# Patient Record
Sex: Male | Born: 1940 | Race: White | Hispanic: No | Marital: Married | Smoking: Former smoker
Health system: Southern US, Community
[De-identification: ages and names within clinical notes are randomized; demographics above are authoritative.]

## PROBLEM LIST (undated history)

## (undated) DIAGNOSIS — K5790 Diverticulosis of intestine, part unspecified, without perforation or abscess without bleeding: Secondary | ICD-10-CM

## (undated) DIAGNOSIS — R059 Cough, unspecified: Secondary | ICD-10-CM

## (undated) DIAGNOSIS — F32A Depression, unspecified: Secondary | ICD-10-CM

## (undated) DIAGNOSIS — R0989 Other specified symptoms and signs involving the circulatory and respiratory systems: Secondary | ICD-10-CM

## (undated) DIAGNOSIS — M1990 Unspecified osteoarthritis, unspecified site: Secondary | ICD-10-CM

## (undated) DIAGNOSIS — E7800 Pure hypercholesterolemia, unspecified: Secondary | ICD-10-CM

## (undated) DIAGNOSIS — I639 Cerebral infarction, unspecified: Secondary | ICD-10-CM

## (undated) DIAGNOSIS — C4490 Unspecified malignant neoplasm of skin, unspecified: Secondary | ICD-10-CM

## (undated) DIAGNOSIS — H9190 Unspecified hearing loss, unspecified ear: Secondary | ICD-10-CM

## (undated) DIAGNOSIS — Z974 Presence of external hearing-aid: Secondary | ICD-10-CM

## (undated) DIAGNOSIS — N189 Chronic kidney disease, unspecified: Secondary | ICD-10-CM

## (undated) HISTORY — DX: Pure hypercholesterolemia, unspecified: E78.00

## (undated) HISTORY — PX: PROSTATE SURGERY: SHX751

## (undated) HISTORY — DX: Unspecified malignant neoplasm of skin, unspecified: C44.90

## (undated) HISTORY — PX: OTHER SURGICAL HISTORY: SHX169

## (undated) HISTORY — PX: COLONOSCOPY: SHX174

## (undated) HISTORY — PX: KNEE ARTHROSCOPY: SUR90

---
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
## 2008-12-16 ENCOUNTER — Ambulatory Visit: Payer: Self-pay | Admitting: Internal Medicine

## 2009-07-27 ENCOUNTER — Ambulatory Visit: Payer: Self-pay | Admitting: Unknown Physician Specialty

## 2010-06-16 ENCOUNTER — Ambulatory Visit: Payer: Self-pay | Admitting: Internal Medicine

## 2010-09-24 ENCOUNTER — Other Ambulatory Visit: Payer: Self-pay | Admitting: Internal Medicine

## 2012-01-31 DIAGNOSIS — I639 Cerebral infarction, unspecified: Secondary | ICD-10-CM

## 2012-01-31 HISTORY — DX: Cerebral infarction, unspecified: I63.9

## 2013-03-27 ENCOUNTER — Inpatient Hospital Stay: Payer: Self-pay | Admitting: Internal Medicine

## 2013-03-27 LAB — URINALYSIS, COMPLETE
Bilirubin,UR: NEGATIVE
Nitrite: POSITIVE
Squamous Epithelial: NONE SEEN
WBC UR: 68 /HPF (ref 0–5)

## 2013-03-27 LAB — CBC
MCH: 30.8 pg (ref 26.0–34.0)
MCHC: 34.2 g/dL (ref 32.0–36.0)
MCV: 90 fL (ref 80–100)
Platelet: 123 10*3/uL — ABNORMAL LOW (ref 150–440)
RDW: 13.1 % (ref 11.5–14.5)
WBC: 23.4 10*3/uL — ABNORMAL HIGH (ref 3.8–10.6)

## 2013-03-27 LAB — COMPREHENSIVE METABOLIC PANEL
Alkaline Phosphatase: 82 U/L (ref 50–136)
Calcium, Total: 8.8 mg/dL (ref 8.5–10.1)
Chloride: 101 mmol/L (ref 98–107)
Co2: 25 mmol/L (ref 21–32)
Creatinine: 1.47 mg/dL — ABNORMAL HIGH (ref 0.60–1.30)
EGFR (Non-African Amer.): 48 — ABNORMAL LOW
Osmolality: 271 (ref 275–301)
Potassium: 3.5 mmol/L (ref 3.5–5.1)
SGPT (ALT): 15 U/L (ref 12–78)
Sodium: 134 mmol/L — ABNORMAL LOW (ref 136–145)
Total Protein: 6.3 g/dL — ABNORMAL LOW (ref 6.4–8.2)

## 2013-03-27 LAB — PHOSPHORUS: Phosphorus: 1.2 mg/dL — ABNORMAL LOW (ref 2.5–4.9)

## 2013-03-28 LAB — CBC WITH DIFFERENTIAL/PLATELET
Basophil #: 0 10*3/uL (ref 0.0–0.1)
Basophil %: 0.1 %
Eosinophil %: 0 %
HCT: 38.1 % — ABNORMAL LOW (ref 40.0–52.0)
Lymphocyte #: 1.4 10*3/uL (ref 1.0–3.6)
MCV: 89 fL (ref 80–100)
Monocyte %: 5.7 %
Neutrophil #: 20.1 10*3/uL — ABNORMAL HIGH (ref 1.4–6.5)
Neutrophil %: 87.9 %
RBC: 4.28 10*6/uL — ABNORMAL LOW (ref 4.40–5.90)
RDW: 12.9 % (ref 11.5–14.5)
WBC: 22.9 10*3/uL — ABNORMAL HIGH (ref 3.8–10.6)

## 2013-03-28 LAB — LIPID PANEL
Cholesterol: <50 mg/dL — ABNORMAL LOW (ref 0–200)
HDL Cholesterol: 20 mg/dL — ABNORMAL LOW (ref 40–60)

## 2013-03-28 LAB — HEPATIC FUNCTION PANEL A (ARMC)
Alkaline Phosphatase: 66 U/L (ref 50–136)
Bilirubin, Direct: 0.2 mg/dL (ref 0.00–0.20)
Bilirubin,Total: 1.6 mg/dL — ABNORMAL HIGH (ref 0.2–1.0)
SGOT(AST): 50 U/L — ABNORMAL HIGH (ref 15–37)
Total Protein: 5.5 g/dL — ABNORMAL LOW (ref 6.4–8.2)

## 2013-03-28 LAB — BASIC METABOLIC PANEL
Calcium, Total: 8.2 mg/dL — ABNORMAL LOW (ref 8.5–10.1)
Chloride: 104 mmol/L (ref 98–107)
Co2: 23 mmol/L (ref 21–32)
Creatinine: 1.27 mg/dL (ref 0.60–1.30)
Osmolality: 272 (ref 275–301)
Potassium: 3.6 mmol/L (ref 3.5–5.1)

## 2013-03-28 LAB — PHOSPHORUS: Phosphorus: 1.7 mg/dL — ABNORMAL LOW (ref 2.5–4.9)

## 2013-03-29 LAB — URINE CULTURE

## 2013-03-29 LAB — BASIC METABOLIC PANEL
BUN: 15 mg/dL (ref 7–18)
Calcium, Total: 8 mg/dL — ABNORMAL LOW (ref 8.5–10.1)
Chloride: 106 mmol/L (ref 98–107)
Co2: 25 mmol/L (ref 21–32)
Creatinine: 1.1 mg/dL (ref 0.60–1.30)
EGFR (African American): >60
Glucose: 104 mg/dL — ABNORMAL HIGH (ref 65–99)
Potassium: 3.8 mmol/L (ref 3.5–5.1)

## 2013-03-29 LAB — CBC WITH DIFFERENTIAL/PLATELET
Basophil #: 0 10*3/uL (ref 0.0–0.1)
HCT: 36.7 % — ABNORMAL LOW (ref 40.0–52.0)
Lymphocyte %: 10.7 %
MCV: 90 fL (ref 80–100)
Monocyte #: 1.2 x10 3/mm — ABNORMAL HIGH (ref 0.2–1.0)
Monocyte %: 7.9 %
Neutrophil #: 12.6 10*3/uL — ABNORMAL HIGH (ref 1.4–6.5)
Platelet: 90 10*3/uL — ABNORMAL LOW (ref 150–440)
RDW: 13 % (ref 11.5–14.5)
WBC: 15.7 10*3/uL — ABNORMAL HIGH (ref 3.8–10.6)

## 2013-03-29 LAB — PHOSPHORUS: Phosphorus: 2.2 mg/dL — ABNORMAL LOW (ref 2.5–4.9)

## 2013-04-01 LAB — CULTURE, BLOOD (SINGLE)

## 2014-02-25 ENCOUNTER — Encounter: Payer: Self-pay | Admitting: Family Medicine

## 2014-03-01 ENCOUNTER — Encounter: Payer: Self-pay | Admitting: Family Medicine

## 2014-04-01 ENCOUNTER — Encounter: Payer: Self-pay | Admitting: Family Medicine

## 2014-11-21 NOTE — Discharge Summary (Signed)
PATIENT NAME:  Randy Hampton, Randy Hampton MR#:  [id number] DATE OF BIRTH:  [birth year]  DATE OF ADMISSION:  03/27/2013 DATE OF DISCHARGE:  03/29/2013  PRIMARY CARE PHYSICIAN: Salome Holmes, MD  FINAL DIAGNOSES:  1.  Clinical sepsis with urinary tract infection.  2.  Urinary retention and benign prostatic hypertrophy.  3.  Acute renal failure.  4.  History of cerebrovascular accident.  5.  Hyperlipidemia.  6.  Thrombocytopenia.  7.  Hypophosphatemia.   DISCHARGE MEDICATIONS: 1.  Atorvastatin 20 mg at bedtime. 2.  Trazodone 50 mg at bedtime. 3.  Aspirin 81 mg daily. 4.  Azelastine nasal spray 137 mcg/inhalation 2 sprays each nostril twice a day. 5.  Vitamin D3 2000 international units daily. 6.  Finasteride 5 mg daily. 7.  Terazosin 10 mg at bedtime. 8.  Cephalexin 500 mg every 8 hours for 7 more days.  DISCHARGE FOLLOWUP:  With Dr. Bernardo Heater in 1 week to remove Foley. Follow up in 1 to 2 weeks with Dr. Salome Holmes.   DIET: Low sodium diet, regular consistency.  ADDITIONAL INSTRUCTIONS: Foley to leg bag.   REASON FOR ADMISSION: The patient was admitted 03/27/2013 and discharged 03/29/2013. Came in with weakness.   HISTORY OF PRESENT ILLNESS: The patient is a 74 year old man with history of BPH and stroke last year with residual left-sided weakness. He had a fever, found to have urinary retention. A Foley catheter was placed in the ER. The patient was started on IV Rocephin and was admitted with clinical sepsis.  LABORATORY AND DIAGNOSTICS: During the hospital course included: A urine culture that grew out Coag-negative staph. Blood cultures were negative. Phosphorus was 1.2. Magnesium 1.3. Urinalysis: 2+ leukocyte esterase, positive nitrites, 2+ blood. Glucose 116, BUN 17, creatinine 1.47, sodium 134, potassium 3.5, chloride 101, CO2 25, calcium 8.8. Liver function tests: Albumin low at 3.3. White blood cell count 23.4, H and H 14.1 and 41.3 and platelet count of 123. CT scan of the head: Old  lacunar infarcts bilaterally. No acute changes.  Chest x-ray: No acute cardiopulmonary disease. Lactic acid 2.3. Ultrasound of the kidneys showed unremarkable evaluation of the kidney. Diffusely thickened urinary bladder wall. Further evaluation with direct visualization recommended. Phosphorus came up to 1.7, magnesium up to 1.8. LDL was unable to be calculated due to non-numeric value within the calculation. HDL was 20, triglycerides 60 and total cholesterol less than 50. Creatinine had improved to 1.11 upon discharge was. White count down from 15.7 and platelet count is 90. Phosphorus was up to 2.2.   HOSPITAL COURSE PER PROBLEM LIST:  1.  For the patient's clinical sepsis and UTI, unfortunately a urine culture grew out Coag-negative staph so I do think this was a contamination. I will complete a course though. Since the patient did get better with the Rocephin, I switched him over to Keflex upon discharge. The patient was afebrile upon discharge. Heart rate 72. White count trending towards the normal range, but was still slightly elevated upon discharge.  2.  For the patient's urinary retention and BPH, I am treating with terazosin and Proscar. The patient had a Foley catheter placed. That will stay in for 7 to 10 days. Follow-up with Dr. Bernardo Heater as outpatient.  3.  Acute renal failure. This was secondary to obstruction. With the Foley catheter and IV fluids creatinine improved.  4.  History of CVA. The patient is on aspirin.  5.  History of hyperlipidemia. The patient on statin. Cholesterol profile is very low.  6.  Thrombocytopenia.  Unclear if this is old or new. Can be further worked up as outpatient. This could also be secondary to the sepsis. Recommend checking a CBC in follow-up appointment.  7.  Hypophosphatemia. This was replaced during the hospital course. Also hypomagnesemia was replaced during the hospital course.   TIME SPENT ON DISCHARGE: 35 minutes.   ____________________________ Tana Conch. Leslye Peer, MD rjw:sb D: 03/29/2013 16:37:28 ET T: 03/29/2013 16:59:51 ET JOB#: [id number]  cc: Tana Conch. Leslye Peer, MD, <Dictator> Salome Holmes, MD Scott C. Bernardo Heater, MD  Marisue Brooklyn MD ELECTRONICALLY SIGNED 04/02/2013 12:20

## 2014-11-21 NOTE — Consult Note (Signed)
Brief Consult Note: Diagnosis: Urinary retention/Acute prostatitis.   Patient was seen by consultant.   Comments: Cont Foley and antibiotics- full consult to follow.  Electronic Signatures: Abbie Sons (MD)  (Signed 28-Aug-14 08:26)  Authored: Brief Consult Note   Last Updated: 28-Aug-14 08:26 by Abbie Sons (MD)

## 2014-11-21 NOTE — H&P (Signed)
PATIENT NAME:  Randy Hampton, Randy Hampton MR#:  [id number] DATE OF BIRTH:  November 01, 1940  DATE OF ADMISSION:  03/27/2013  REFERRING PHYSICIAN: Briant Sites. Joni Fears, MD  PRIMARY CARE PHYSICIAN: Salome Holmes, MD  CHIEF COMPLAINT: Weakness.   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 He has a urologist as an outpatient who he has been following with. He has history of urinary retention while he was hospitalized last year for his stroke. He comes in after experiencing weakness and fevers since yesterday. He normally walks with a 4-prong walker, uses a lift chair to get up. This morning, he was so weak that he could not stand up. Overnight, he also had a fever as high as 104. He states that he has urinary urgency, but cannot urinate. He came in here and was noted to have urinary retention of about 550 mL. He attempted to urinate, and only urinated 50 mL. A Foley has been ordered. Furthermore, the patient had a fever and also tachycardia and leukocytosis. His creatinine is also elevated. Hospitalist services were contacted for further evaluation and management. Of note, the patient has a dose of ceftriaxone.   PAST MEDICAL HISTORY:  1. BPH.  2. History of urinary retention.  3. Depression.  4. 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  5. Hyperlipidemia.  SURGERIES: 1. Knee surgeries x2.  2. 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   FAMILY HISTORY: Hyperlipidemia in mother.   ALLERGIES: AGGRENOX.   SOCIAL HISTORY: No tobacco, alcohol or drug use. Lives with his wife. Retired.   OUTPATIENT MEDICATIONS:  1. Aspirin 81 mg daily.  2. Tamsulosin 0.4 mg 2 times a day.  3. Atorvastatin 20 mg daily.  4. Azelastine 2 sprays 2 times a day.  5. Trazodone 100 mg daily.  6. Vitamin D3 2000  international units daily.   REVIEW OF SYSTEMS:  CONSTITUTIONAL: Weight is steady. Positive for weakness, fevers.  EYES: Has chronic blurry vision and wears glasses.  ENT: Has hearing aids and hearing loss. No nasal discharge.  RESPIRATORY: Has nocturnal cough. Gets better if he props up a couple of pillows under his head. No wheezing, shortness of breath, dyspnea on exertion, asthma or painful respirations.  CARDIOVASCULAR: Denies chest pain, palpitations, swelling in the legs, arrhythmia or history of MI.  GASTROINTESTINAL: No nausea, vomiting, diarrhea, abdominal pain, melena, bloody stools or dark stools.  GENITOURINARY: Has no hematuria or dysuria, but has urgency. Wants to urinate, but he cannot.  HEMATOLOGIC AND LYMPHATIC: No anemia or easy bruising.  SKIN: No rashes.  MUSCULOSKELETAL: Denies arthritis or gout.  NEUROLOGIC: Has left-sided weakness secondary to his stroke, has rigidity and fasciculations.  PSYCHIATRIC: Has depression.   PHYSICAL EXAMINATION:  VITAL SIGNS: Temperature on arrival noted to be 99.2 initially, T-max was 100.7, initial pulse rate 116, respiratory rate 18, blood pressure 122/62, O2 saturation 97% on room air.  GENERAL: The patient is a well-developed Caucasian male lying in bed in no obvious distress.  HEENT: Normocephalic, atraumatic. Pupils are equal and reactive. Anicteric sclerae. Extraocular muscles intact. Moist mucous membranes.  NECK: Supple. No thyroid tenderness. No cervical lymphadenopathy. No JVD.  CARDIOVASCULAR: S1, S2, tachycardic. No murmurs, rubs or gallops.  LUNGS: Clear to auscultation without wheezing, rhonchi or rales.  ABDOMEN: Soft, nontender, nondistended. Positive bowel sounds in all quadrants. Somewhat  distended bladder, without a Foley currently.  EXTREMITIES: Does not exhibit any lower extremity edema.  NEUROLOGIC: Cranial nerves II through XII grossly intact. Strength is 5 out of 5 on the right upper and lower extremities, is 4+  out of 5 in the left lower extremity and 4 out of 5 in the left upper extremity. The patient has rigidity in the upper extremity and poor coordination and has visible fasciculations in the thigh muscles. Sensation is intact to light touch.  PSYCHIATRIC: Awake, alert, oriented x3. Pleasant, cooperative.   LABORATORY DATA: Glucose 116, BUN 17, creatinine 1.47, sodium 134, potassium 3.5, phosphorus is 1.2, magnesium 1.3, albumin os 3.3, total protein 6.3, bilirubin is 3.5. Alkaline phosphatase, AST and ALT are within normal limits. WBC is 23.4, hemoglobin 14.1, platelets are 123. UA shows 2+ blood, positive nitrites, 2+ leukocyte esterase, 5 RBC and 68 WBC, 1+ bacteria. CAT scan of the head without contrast, done for weakness, shows no evidence of acute ischemic or hemorrhagic infarct. Old lacunar infarcts in the basal ganglia bilaterally are present. EKG shows sinus tach, rate is 111, some PVCs which are frequent. Chest x-ray, 1 view, shows no acute cardiopulmonary disease.   ASSESSMENT AND PLAN: We have a 74 year old male with history of a stroke, depression, benign prostatic hypertrophy, urinary retention in the past, hyperlipidemia, who presents with weakness and confusion per wife, who seems better this morning after fever has come down, presenting with severe sepsis, renal failure and urinary retention with electrolyte abnormalities. At this point, would admit the patient to the hospital. The patient has severe sepsis per criteria, including tachycardia, fever, leukocytosis, renal failure and urinary tract infection as a source. He has acute urinary retention as well, likely secondary to his benign prostatic hypertrophy and urinary tract infection. At this point, would follow with the blood cultures and urine cultures, which I have ordered. He has received a dose of ceftriaxone, which I would continue. I will start the patient on Tylenol for fevers. Would decompress the bladder. Obtain a renal ultrasound as  well for the renal failure, which I suspect is secondary to urinary retention and sepsis. Would start the patient on some gentle fluids as well and see how he does with the above therapy. Hopefully, the renal failure should improve with Foley placement and decompression of the bladder, and would order a renal ultrasound to see if there is any evidence for hydronephrosis. Will also obtain a urology consult. Of note, he sees a Dr. Ethelene Hal in [place], and the next appointment, however, is close to 2 months from now. Will see if we can obtain a urology consult here. He has severe laboratory abnormalities, including hypomagnesemia and hypophosphatemia. We will recheck a magnesium in the morning as well as a phosphate and provide Neutra-Phos replacement, and magnesium replacement has already been ordered. We would also resume the aspirin and statin for his history of stroke. We would continue the tamsulosin and add Proscar as well for the benign prostatic hypertrophy. He has elevated bilirubinemia, but normal alkaline phosphatase, AST and ALT. I would go ahead and obtain a direct bilirubin and recheck hepatic function in the morning as well.   CODE STATUS: The patient is DNR.   TOTAL TIME SPENT: 60 minutes.   ____________________________ Vivien Presto, MD sa:OSi D: 03/27/2013 12:14:50 ET T: 03/27/2013 12:27:42 ET JOB#: [id number]  cc: Vivien Presto, MD, <Dictator> Salome Holmes, MD Vivien Presto MD ELECTRONICALLY SIGNED 04/23/2013 13:46

## 2014-11-21 NOTE — Consult Note (Signed)
PATIENT NAME:  Randy Hampton, Randy Hampton MR#:  [id number] DATE OF BIRTH:  [birth year]  DATE OF CONSULTATION:  03/28/2013  REFERRING PHYSICIAN: Dr. Bridgette Habermann CONSULTING PHYSICIAN:  Nicki Reaper C. Bernardo Heater, MD  REASON FOR CONSULTATION: Urinary retention/urinary tract infection.   HISTORY OF PRESENT ILLNESS: This is a 74 year old male followed by [facility] for BPH, who presented to the Emergency Department on 08/27 with weakness, fever to 104 degrees, urinary urgency and inability to urinate. On presentation to the Emergency Department, residual was found to be 550 mL.  Foley catheter was placed. He was also noted to have fever, tachycardia and leukocytosis. He was admitted for sepsis. He is followed by [facility]. He states he was having problem with BPH prior to his stroke last year; however, had a history of urinary retention while hospitalized for the stroke. He states he cannot remember the extent of  evaluation by the urologist, but his wife would know, who was not present at the time of this visit. He currently feels better after catheter placement and antibiotics.   PAST MEDICAL HISTORY:  1.  BPH with urinary retention.  2.  Depression.  3.  CVA with left-sided hemiparesis.  4.  Hyperlipidemia.   PAST SURGICAL HISTORY:  1.  Knee surgeries x 2.  2.  Skin cancer excisions.   FAMILY HISTORY: Noncontributory.   ALLERGIES: AGGRENOX.   SOCIAL HISTORY: No tobacco or alcohol use. The patient is retired.   MEDICATIONS ON ADMISSION: ASA 81 mg daily, tamsulosin 0.4 mg b.i.d. Atorvastatin 20 mg daily,  trazodone 100 mg daily and vitamin D3 2000 international units daily.   REVIEW OF SYSTEMS: Otherwise noncontributory except as per the HPI.   PHYSICAL EXAMINATION:  VITAL SIGNS: Temperature 99.4, pulse 79, BP 95/58.  GENERAL: Alert male in no acute distress.  ABDOMEN: Soft, nontender.  GU: Phallus without lesions. Foley catheter draining darker urine. Prostate exam was deferred at this time.   DATA:  Creatinine on admission 1.47, 1.27 this morning. WBC 22.9. Renal ultrasound shows no hydronephrosis. The bladder is decompressed. Blood cultures are negative to date. Urine is being held for possible pathogen.   IMPRESSION:  1.  Febrile urinary tract infection - probable acute prostatitis.  2.  Benign prostatic hypertrophy.  3.  Urinary retention.   RECOMMENDATIONS:  1.  Continue IV antibiotics pending urine culture results.  2.  Continue tamsulosin.  3.  I would recommend Foley catheter drainage for 7 to 10 days.  4.  He may follow up with [facility] for catheter removal and further evaluation.  ____________________________ Ronda Fairly Bernardo Heater, MD scs:aw D: 03/28/2013 13:17:29 ET T: 03/28/2013 13:39:02 ET JOB#: [id number]  cc: Nicki Reaper C. Bernardo Heater, MD, <Dictator> Abbie Sons MD ELECTRONICALLY SIGNED 04/03/2013 11:16

## 2018-08-10 ENCOUNTER — Other Ambulatory Visit: Payer: Self-pay | Admitting: Internal Medicine

## 2018-08-10 ENCOUNTER — Other Ambulatory Visit: Payer: Self-pay | Admitting: Family Medicine

## 2018-08-10 DIAGNOSIS — R1312 Dysphagia, oropharyngeal phase: Secondary | ICD-10-CM

## 2018-09-05 ENCOUNTER — Other Ambulatory Visit: Payer: Self-pay | Admitting: Family Medicine

## 2018-09-05 ENCOUNTER — Ambulatory Visit
Admission: RE | Admit: 2018-09-05 | Discharge: 2018-09-05 | Disposition: A | Discharge status: Home / Self Care | Payer: Medicare HMO | Source: Ambulatory Visit | Attending: Family Medicine | Admitting: Family Medicine

## 2018-09-05 DIAGNOSIS — R1312 Dysphagia, oropharyngeal phase: Secondary | ICD-10-CM | POA: Insufficient documentation

## 2018-09-05 NOTE — Therapy (Signed)
[place]andy [place], [place], [address] Phone: [phone]   Fax:     Modified Barium Swallow  Patient Details  Name: Randy Hampton MRN: [id number] Date of Birth: [birth year] No data recorded  Encounter Date: 09/05/2018    No past medical history on file.    There were no vitals filed for this visit.   Subjective: Patient behavior: (alertness, ability to follow instructions, etc.): The patient is alert, able to verbalize his swallowing complaints, and follow directions.  Chief complaint:  The patient reports coughing with drinking through straw (if he puts the straw too far into his mouth) and eating mixed textures (cereal and milk)   Objective:  Radiological Procedure: A videoflouroscopic evaluation of oral-preparatory, reflex initiation, and pharyngeal phases of the swallow was performed; as well as a screening of the upper esophageal phase.  I. POSTURE: Upright in MBS chair  II. VIEW: Lateral  III. COMPENSATORY STRATEGIES: N/A  IV. BOLUSES ADMINISTERED:   Thin Liquid: 2 cup rim, 2 straw sips   Nectar-thick Liquid: 1 moderate   Honey-thick Liquid: DNT   Puree: 2 teaspoon presentations   Mechanical Soft: 1/4 graham cracker in applesauce  V. RESULTS OF EVALUATION: A. ORAL PREPARATORY PHASE: (The lips, tongue, and velum are observed for strength and coordination)       **Overall Severity Rating: within functional limits (slowed oral management, but no other concerns)  B. SWALLOW INITIATION/REFLEX: (The reflex is normal if "triggered" by the time the bolus reached the base of the tongue)  **Overall Severity Rating: Mild; triggers at the valleculae  C. PHARYNGEAL PHASE: (Pharyngeal function is normal if the bolus shows rapid, smooth, and continuous transit through the pharynx and there is no pharyngeal residue after the swallow)  **Overall Severity Rating: within normal  limits   D. LARYNGEAL PENETRATION: (Material entering into the laryngeal inlet/vestibule but not aspirated) NONE  E. ASPIRATION: NONE  F. ESOPHAGEAL PHASE: (Screening of the upper esophagus) No abnormality within the viewable cervical esophagus  ASSESSMENT: This 78 year old man; with concern for aspiration secondary reported coughing with drinking through straw and eating mixed textures; is presenting with minimal oropharyngeal dysphagia characterized by delayed pharyngeal swallow initiation.  Oral control of the bolus including oral hold, rotary mastication, and anterior to posterior transfer is within functional limits (slowed oral management, but no other concerns).   Aspects of the pharyngeal stage of swallowing including tongue base retraction, hyolaryngeal excursion, epiglottic inversion, and duration/amplitude of UES opening are within normal limits.  There is no observed pharyngeal residue, laryngeal penetration, or tracheal aspiration.  The patient is not at significant risk for prandial aspiration.  The patient was counseled that his swallowing is safe from an aspiration point of view.  He was advised to avoid textures or methods of drinking that cause him to cough.  However, he can be re-assured that even if he did aspirate, the cough is protective and he should be fine.  PLAN/RECOMMENDATIONS:   A. Diet: Regular   B. Swallowing Precautions: avoid mixed textures, avoid straw drinking that elicits cough   C. Recommended consultation to: N/A   D. Therapy recommendations: speech therapy is not indicated   E. Results and recommendations were discussed with the patient immediately following the study and the final report routed to the referring MD.   Oropharyngeal dysphagia - Plan: DG SWALLOW FUNC OP MEDICARE SPEECH PATH, DG SWALLOW FUNC OP MEDICARE SPEECH PATH, CANCELED: DG SWALLOW FUNC W VID CINE SCOUT  NECK DELAYED IMAGE WITH BA, CANCELED: DG SWALLOW FUNC W VID CINE SCOUT NECK DELAYED  IMAGE WITH BA        Problem List There are no active problems to display for this patient.  Randy Sea, MS/CCC- SLP  Lou Miner 09/05/2018, Wandra Mannan PM  [facility] [place], [place], [address] Phone: [phone]   Fax:     Name: Randy Hampton MRN: [id number] Date of Birth: 04-06-1941

## 2019-02-26 ENCOUNTER — Other Ambulatory Visit: Payer: Self-pay

## 2019-02-26 ENCOUNTER — Emergency Department
Admission: EM | Admit: 2019-02-26 | Discharge: 2019-02-26 | Disposition: A | Discharge status: Home / Self Care | Payer: Medicare HMO | Attending: Emergency Medicine | Admitting: Emergency Medicine

## 2019-02-26 ENCOUNTER — Emergency Department: Payer: Medicare HMO

## 2019-02-26 ENCOUNTER — Encounter: Payer: Self-pay | Admitting: *Deleted

## 2019-02-26 DIAGNOSIS — Y92094 Garage of other non-institutional residence as the place of occurrence of the external cause: Secondary | ICD-10-CM | POA: Diagnosis not present

## 2019-02-26 DIAGNOSIS — Z79899 Other long term (current) drug therapy: Secondary | ICD-10-CM | POA: Diagnosis not present

## 2019-02-26 DIAGNOSIS — W01198A Fall on same level from slipping, tripping and stumbling with subsequent striking against other object, initial encounter: Secondary | ICD-10-CM | POA: Insufficient documentation

## 2019-02-26 DIAGNOSIS — Z7982 Long term (current) use of aspirin: Secondary | ICD-10-CM | POA: Diagnosis not present

## 2019-02-26 DIAGNOSIS — S0101XA Laceration without foreign body of scalp, initial encounter: Secondary | ICD-10-CM

## 2019-02-26 DIAGNOSIS — Y9301 Activity, walking, marching and hiking: Secondary | ICD-10-CM | POA: Diagnosis not present

## 2019-02-26 DIAGNOSIS — Z8673 Personal history of transient ischemic attack (TIA), and cerebral infarction without residual deficits: Secondary | ICD-10-CM | POA: Diagnosis not present

## 2019-02-26 DIAGNOSIS — Y998 Other external cause status: Secondary | ICD-10-CM | POA: Diagnosis not present

## 2019-02-26 DIAGNOSIS — S0990XA Unspecified injury of head, initial encounter: Secondary | ICD-10-CM | POA: Diagnosis present

## 2019-02-26 MED ORDER — LIDOCAINE-EPINEPHRINE 2 %-1:100000 IJ SOLN: 30.0000 mL | Freq: Once | INTRAMUSCULAR | Status: DC

## 2019-02-26 NOTE — Discharge Instructions (Signed)
Do not get the sutured area wet for 24 hours. After 24 hours, shower/bathe as usual and pat the area dry. Change the bandage 2 times per day and apply antibiotic ointment. See your PCP or go to Urgent Care in 7 days for suture removal or sooner for signs or concern of infection.

## 2019-02-26 NOTE — ED Provider Notes (Signed)
[facility] Emergency Department Provider Note  ____________________________________________  Time seen: Approximately 6:44 PM  I have reviewed the triage vital signs and the nursing notes.   HISTORY  Chief Complaint Fall and Head Laceration   HPI Randy Hampton is a 78 y.o. male who presents to the emergency department for treatment and evaluation after mechanical, non-syncopal fall where he tripped while carrying some drinks.  His head struck his car that was parked in the garage.  He has a laceration to his scalp.  He denies loss of consciousness or other injuries.  He is not currently on any anticoagulants.  He does think that his Tdap is up-to-date.   Past Medical History:  Diagnosis Date  . Stroke [facility])    2013    There are no active problems to display for this patient.   History reviewed. No pertinent surgical history.  Prior to Admission medications   Medication Sig Start Date End Date Taking? Authorizing Provider  aspirin 81 MG chewable tablet Chew by mouth daily.   Yes [provider]  atorvastatin (LIPITOR) 20 MG tablet Take 20 mg by mouth daily.   Yes [provider]    Allergies Patient has no known allergies.  History reviewed. No pertinent family history.  Social History Social History   Tobacco Use  . Smoking status: Never Smoker  . Smokeless tobacco: Never Used  Substance Use Topics  . Alcohol use: Not Currently  . Drug use: Not Currently    Review of Systems  Constitutional: Negative for fever. Respiratory: Negative for cough or shortness of breath.  Musculoskeletal: Negative for myalgias Skin: Positive for laceration to the scalp. Neurological: Negative for numbness or paresthesias. ____________________________________________   PHYSICAL EXAM:  VITAL SIGNS: ED Triage Vitals  Enc Vitals Group     BP 02/26/19 1709 125/78     Pulse Rate 02/26/19 1709 80     Resp 02/26/19 1709 16     Temp  02/26/19 1709 98 F (36.7 C)     Temp Source 02/26/19 1709 Oral     SpO2 02/26/19 1709 94 %     Weight 02/26/19 1710 200 lb (90.7 kg)     Height 02/26/19 1710 5\' 10"  (1.778 m)     Head Circumference --      Peak Flow --      Pain Score 02/26/19 1710 2     Pain Loc --      Pain Edu? --      Excl. in [place]? --      Constitutional: Well appearing. Eyes: Conjunctivae are clear without discharge or drainage. Nose: No rhinorrhea noted. Mouth/Throat: Airway is patent.  Neck: No stridor. Unrestricted range of motion observed. Cardiovascular: Capillary refill is <3 seconds.  Respiratory: Respirations are even and unlabored.. Musculoskeletal: Unrestricted range of motion observed. Neurologic: Awake, alert, and oriented x 4.  Skin: 6 cm laceration to the right parietal scalp.  Bleeding well controlled.  ____________________________________________   LABS (all labs ordered are listed, but only abnormal results are displayed)  Labs Reviewed - No data to display ____________________________________________  EKG  Not indicated. ____________________________________________  RADIOLOGY  CT of the head and cervical spine are reassuring.  No acute findings per radiology. ____________________________________________   PROCEDURES  .Marland KitchenLaceration Repair  Date/Time: 02/26/2019 6:48 PM Performed by: Victorino Dike, FNP Authorized by: Victorino Dike, FNP   Consent:    Consent obtained:  Verbal   Consent given by:  Patient   Risks  discussed:  Poor cosmetic result Anesthesia (see MAR for exact dosages):    Anesthesia method:  Local infiltration   Local anesthetic:  Lidocaine 2% WITH epi Laceration details:    Location:  Scalp   Scalp location:  R parietal   Length (cm):  6 Repair type:    Repair type:  Simple Treatment:    Area cleansed with:  Betadine   Amount of cleaning:  Standard   Irrigation solution:  Sterile saline   Irrigation method:  Syringe Skin repair:    Repair  method:  Staples   Number of staples:  6 Approximation:    Approximation:  Close Post-procedure details:    Dressing:  Open (no dressing)   Patient tolerance of procedure:  Tolerated well, no immediate complications   ____________________________________________   INITIAL IMPRESSION / ASSESSMENT AND PLAN / ED COURSE  Randy Hampton is a 78 y.o. male who presents to the emergency department for treatment and evaluation after sustaining a mechanical, non-syncopal fall at home.  CT of the cervical spine and head are reassuring.  Wound was repaired as above.  Wound care was discussed.  He will follow-up with his primary care provider, urgent care, or return here in approximately 7 days for removal of the staples.  He will follow-up with primary care sooner for concerns.  Tetanus was given on August 26, 2014 based on review of his chart.   Medications  lidocaine-EPINEPHrine (XYLOCAINE W/EPI) 2 %-1:100000 (with pres) injection 30 mL (has no administration in time range)     Pertinent labs & imaging results that were available during my care of the patient were reviewed by me and considered in my medical decision making (see chart for details).  ____________________________________________   FINAL CLINICAL IMPRESSION(S) / ED DIAGNOSES  Final diagnoses:  Scalp laceration, initial encounter  Minor head injury, initial encounter    ED Discharge Orders    None       Note:  This document was prepared using Dragon voice recognition software and may include unintentional dictation errors.   Victorino Dike, FNP 02/26/19 1850    Vanessa [place], MD 02/26/19 2039

## 2019-02-26 NOTE — ED Triage Notes (Signed)
Pt report while carrying drinks inside he went to step up some stairs and his left leg didn't lift appropriately and he fell. Laceration to the right side of pts head. No LOC and no blood thinners. No neuro deficits noted.

## 2019-12-05 ENCOUNTER — Other Ambulatory Visit: Payer: Self-pay | Admitting: Nephrology

## 2019-12-05 DIAGNOSIS — N1831 Chronic kidney disease, stage 3a: Secondary | ICD-10-CM

## 2019-12-13 ENCOUNTER — Other Ambulatory Visit: Payer: Self-pay

## 2019-12-13 ENCOUNTER — Ambulatory Visit
Admission: RE | Admit: 2019-12-13 | Discharge: 2019-12-13 | Disposition: A | Discharge status: Home / Self Care | Payer: Medicare HMO | Source: Ambulatory Visit | Attending: Nephrology | Admitting: Nephrology

## 2019-12-13 DIAGNOSIS — N1831 Chronic kidney disease, stage 3a: Secondary | ICD-10-CM

## 2020-01-09 ENCOUNTER — Encounter: Payer: Self-pay | Admitting: Oncology

## 2020-01-09 ENCOUNTER — Inpatient Hospital Stay: Payer: Medicare HMO | Attending: Oncology | Admitting: Oncology

## 2020-01-09 ENCOUNTER — Inpatient Hospital Stay: Payer: Medicare HMO

## 2020-01-09 ENCOUNTER — Other Ambulatory Visit: Payer: Self-pay

## 2020-01-09 VITALS — BP 112/71 | HR 69 | Temp 97.6°F | Wt 184.7 lb

## 2020-01-09 DIAGNOSIS — Z85828 Personal history of other malignant neoplasm of skin: Secondary | ICD-10-CM

## 2020-01-09 DIAGNOSIS — R809 Proteinuria, unspecified: Secondary | ICD-10-CM | POA: Insufficient documentation

## 2020-01-09 DIAGNOSIS — Z808 Family history of malignant neoplasm of other organs or systems: Secondary | ICD-10-CM

## 2020-01-09 DIAGNOSIS — Z8673 Personal history of transient ischemic attack (TIA), and cerebral infarction without residual deficits: Secondary | ICD-10-CM | POA: Diagnosis not present

## 2020-01-09 DIAGNOSIS — R778 Other specified abnormalities of plasma proteins: Secondary | ICD-10-CM | POA: Insufficient documentation

## 2020-01-09 DIAGNOSIS — N183 Chronic kidney disease, stage 3 unspecified: Secondary | ICD-10-CM | POA: Diagnosis not present

## 2020-01-09 NOTE — Progress Notes (Signed)
Hematology/Oncology Consult note [facility] [phone] [phone]   Patient Care Team: Hortencia Pilar, MD as PCP - General (Family Medicine)  REFERRING PROVIDER: Anthonette Legato, MD  CHIEF COMPLAINTS/REASON FOR VISIT:  Evaluation of abnormal UPEP  HISTORY OF PRESENTING ILLNESS:   Randy Hampton is a  79 y.o.  male with PMH listed below was seen in consultation at the request of  Anthonette Legato, MD  for evaluation of abnormal UPEP Patient recently establish care with nephrologist Dr. Holley Raring for chronic kidney disease stage III. Work-up showed abnormal UPEP.  Patient was referred to hematology oncology for further evaluation. Patient has no new complaints.  He feels well. Denies any constitutional symptoms.  Past medical history includes history of stroke for which he has some gait difficulties.  History of skin cancer and hypercholesterolemia  Review of Systems  Constitutional: Negative for appetite change, chills, fatigue, fever and unexpected weight change.  HENT:   Negative for hearing loss and voice change.   Eyes: Negative for eye problems and icterus.  Respiratory: Negative for chest tightness, cough and shortness of breath.   Cardiovascular: Negative for chest pain and leg swelling.  Gastrointestinal: Negative for abdominal distention and abdominal pain.  Endocrine: Negative for hot flashes.  Genitourinary: Negative for difficulty urinating, dysuria and frequency.   Musculoskeletal: Negative for arthralgias.  Skin: Negative for itching and rash.  Neurological: Negative for light-headedness and numbness.  Hematological: Negative for adenopathy. Does not bruise/bleed easily.  Psychiatric/Behavioral: Negative for confusion.    MEDICAL HISTORY:  Past Medical History:  Diagnosis Date  . Hypercholesteremia   . Skin cancer    basel cell, squamous cell  . Stroke [facility])    2013    SURGICAL HISTORY: History reviewed. No  pertinent surgical history.  SOCIAL HISTORY: Social History   Socioeconomic History  . Marital status: Married    Spouse name: Not on file  . Number of children: Not on file  . Years of education: Not on file  . Highest education level: Not on file  Occupational History  . Not on file  Tobacco Use  . Smoking status: Never Smoker  . Smokeless tobacco: Never Used  Substance and Sexual Activity  . Alcohol use: Not Currently  . Drug use: Not Currently  . Sexual activity: Not Currently  Other Topics Concern  . Not on file  Social History Narrative  . Not on file   Social Determinants of Health   Financial Resource Strain:   . Difficulty of Paying Living Expenses:   Food Insecurity:   . Worried About Charity fundraiser in the Last Year:   . Arboriculturist in the Last Year:   Transportation Needs:   . Film/video editor (Medical):   Marland Kitchen Lack of Transportation (Non-Medical):   Physical Activity:   . Days of Exercise per Week:   . Minutes of Exercise per Session:   Stress:   . Feeling of Stress :   Social Connections:   . Frequency of Communication with Friends and Family:   . Frequency of Social Gatherings with Friends and Family:   . Attends Religious Services:   . Active Member of Clubs or Organizations:   . Attends Archivist Meetings:   Marland Kitchen Marital Status:   Intimate Partner Violence:   . Fear of Current or Ex-Partner:   . Emotionally Abused:   Marland Kitchen Physically Abused:   . Sexually Abused:     FAMILY HISTORY: Family History  Problem Relation Age of Onset  . Skin cancer Mother     ALLERGIES:  is allergic to aspirin-dipyridamole er.  MEDICATIONS:  Current Outpatient Medications  Medication Sig Dispense Refill  . aspirin 81 MG chewable tablet Chew by mouth daily.    Marland Kitchen atorvastatin (LIPITOR) 20 MG tablet Take 20 mg by mouth daily.    . Cholecalciferol 50 MCG (2000 UT) TABS Take by mouth.    . tamsulosin (FLOMAX) 0.4 MG CAPS capsule Take by mouth.       No current facility-administered medications for this visit.     PHYSICAL EXAMINATION: ECOG PERFORMANCE STATUS: 1 - Symptomatic but completely ambulatory Vitals:   01/09/20 0919  BP: 112/71  Pulse: 69  Temp: 97.6 F (36.4 C)  SpO2: 99%   Filed Weights   01/09/20 0919  Weight: 184 lb 11.2 oz (83.8 kg)    Physical Exam Constitutional:      General: He is not in acute distress. HENT:     Head: Normocephalic and atraumatic.  Eyes:     General: No scleral icterus. Cardiovascular:     Rate and Rhythm: Normal rate and regular rhythm.     Heart sounds: Normal heart sounds.  Pulmonary:     Effort: Pulmonary effort is normal. No respiratory distress.     Breath sounds: No wheezing.  Abdominal:     General: Bowel sounds are normal. There is no distension.     Palpations: Abdomen is soft.  Musculoskeletal:        General: No deformity. Normal range of motion.     Cervical back: Normal range of motion and neck supple.  Skin:    General: Skin is warm and dry.     Findings: No erythema or rash.  Neurological:     Mental Status: He is alert and oriented to person, place, and time. Mental status is at baseline.     Cranial Nerves: No cranial nerve deficit.     Coordination: Coordination normal.  Psychiatric:        Mood and Affect: Mood normal.     LABORATORY DATA:  I have reviewed the data as listed Lab Results  Component Value Date   WBC 15.7 (H) 03/29/2013   HGB 12.4 (L) 03/29/2013   HCT 36.7 (L) 03/29/2013   MCV 90 03/29/2013   PLT 90 (L) 03/29/2013   No results for input(s): NA, K, CL, CO2, GLUCOSE, BUN, CREATININE, CALCIUM, GFRNONAA, GFRAA, PROT, ALBUMIN, AST, ALT, ALKPHOS, BILITOT, BILIDIR, IBILI in the last 8760 hours. Iron/TIBC/Ferritin/ %Sat No results found for: IRON, TIBC, FERRITIN, IRONPCTSAT    RADIOGRAPHIC STUDIES: I have personally reviewed the radiological images as listed and agreed with the findings in the report. US RENAL  Result Date:  12/14/2019 CLINICAL DATA:  Chronic kidney disease EXAM: RENAL / URINARY TRACT ULTRASOUND COMPLETE COMPARISON:  Ultrasound 03/27/2013 FINDINGS: Right Kidney: Renal measurements: 9.9 x 4.8 x 6.2 cm = volume: 154 mL. Slightly echogenic cortex. No hydronephrosis or mass. Mild diffuse cortical thinning. Left Kidney: Renal measurements: 11.4 x 6.2 x 5.6 cm = volume: 206.5 mL. Slightly echogenic cortex. No hydronephrosis. Mild cortical thinning. Bladder: Appears normal for degree of bladder distention. Other: Lobulated mass at the posterior bladder. IMPRESSION: 1. Slightly echogenic kidneys consistent with medical renal disease. No hydronephrosis. 2. 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 Electronically Signed   By: Donavan Foil M.D.   On: 12/14/2019 02:01      ASSESSMENT & PLAN:  1. Proteinuria, unspecified type    Labs from nephrologist office was reviewed and discussed with patient. Serum protein electrophoresis showed a decreased beta 1, globulin percentage. Urine electrophoresis showed increased urine protein/creatinine ratio, possible abnormal protein band detected in the beta globulins. Discussed with patient that I will obtain multiple myeloma panel, light chain ratio and urine protein electrophoresis and immunofixation.  Orders Placed This Encounter  Procedures  . Technologist smear review    Standing Status:   Future    Standing Expiration Date:   01/08/2021  . Multiple Myeloma Panel (SPEP&IFE w/QIG)    Standing Status:   Future    Standing Expiration Date:   01/08/2021  . Kappa/lambda light chains    Standing Status:   Future    Standing Expiration Date:   01/08/2021  . CBC with Differential/Platelet    Standing Status:   Future    Standing Expiration Date:   01/08/2021  . Technologist smear review    Standing Status:   Future    Standing Expiration Date:   01/08/2021  . Comprehensive metabolic panel    Standing Status:   Future     Standing Expiration Date:   01/08/2021  . IFE and PE, Random Urine    Standing Status:   Future    Standing Expiration Date:   01/08/2021    All questions were answered. The patient knows to call the clinic with any problems questions or concerns.  cc Holley Raring, [place], MD    Return of visit: To be determined. Thank you for this kind referral and the opportunity to participate in the care of this patient. A copy of today's note is routed to referring provider    Earlie Server, MD, PhD Hematology Oncology [facility] Pager- [phone] 01/09/2020

## 2020-01-10 ENCOUNTER — Telehealth: Payer: Self-pay

## 2020-01-10 NOTE — Telephone Encounter (Signed)
Please schedule him for labs next week and I will inform him appt detail.

## 2020-01-10 NOTE — Telephone Encounter (Signed)
-----   Message from Earlie Server, MD sent at 01/09/2020  7:37 PM EDT ----- Please let him know that he is referred to me for abnormal protein level in the urine. Please arrange him to get lab encounter for testing. Follow up tbd.

## 2020-01-10 NOTE — Telephone Encounter (Signed)
Done...   Pt is sched to RTC to have labs drawn on 01/14/20 @ 1:15

## 2020-01-10 NOTE — Telephone Encounter (Signed)
LM to call for MD recommendation.

## 2020-01-14 ENCOUNTER — Inpatient Hospital Stay: Payer: Medicare HMO

## 2020-01-14 ENCOUNTER — Other Ambulatory Visit: Payer: Self-pay

## 2020-01-14 DIAGNOSIS — R809 Proteinuria, unspecified: Secondary | ICD-10-CM | POA: Diagnosis not present

## 2020-01-14 LAB — COMPREHENSIVE METABOLIC PANEL
ALT: 10 U/L (ref 0–44)
AST: 15 U/L (ref 15–41)
Albumin: 4.2 g/dL (ref 3.5–5.0)
Alkaline Phosphatase: 82 U/L (ref 38–126)
Anion gap: 7 (ref 5–15)
BUN: 24 mg/dL — ABNORMAL HIGH (ref 8–23)
CO2: 30 mmol/L (ref 22–32)
Calcium: 9.2 mg/dL (ref 8.9–10.3)
Chloride: 105 mmol/L (ref 98–111)
Creatinine, Ser: 1.74 mg/dL — ABNORMAL HIGH (ref 0.61–1.24)
GFR calc Af Amer: 43 mL/min — ABNORMAL LOW (ref >60)
GFR calc non Af Amer: 37 mL/min — ABNORMAL LOW (ref >60)
Glucose, Bld: 93 mg/dL (ref 70–99)
Potassium: 4.9 mmol/L (ref 3.5–5.1)
Sodium: 142 mmol/L (ref 135–145)
Total Bilirubin: 1.6 mg/dL — ABNORMAL HIGH (ref 0.3–1.2)
Total Protein: 6.9 g/dL (ref 6.5–8.1)

## 2020-01-14 LAB — CBC WITH DIFFERENTIAL/PLATELET
Abs Immature Granulocytes: 0.03 10*3/uL (ref 0.00–0.07)
Basophils Absolute: 0 10*3/uL (ref 0.0–0.1)
Basophils Relative: 0 %
Eosinophils Absolute: 0.3 10*3/uL (ref 0.0–0.5)
Eosinophils Relative: 3 %
HCT: 47.1 % (ref 39.0–52.0)
Hemoglobin: 15.5 g/dL (ref 13.0–17.0)
Immature Granulocytes: 0 %
Lymphocytes Relative: 28 %
Lymphs Abs: 2.5 10*3/uL (ref 0.7–4.0)
MCH: 30.3 pg (ref 26.0–34.0)
MCHC: 32.9 g/dL (ref 30.0–36.0)
MCV: 92 fL (ref 80.0–100.0)
Monocytes Absolute: 0.6 10*3/uL (ref 0.1–1.0)
Monocytes Relative: 6 %
Neutro Abs: 5.7 10*3/uL (ref 1.7–7.7)
Neutrophils Relative %: 63 %
Platelets: 191 10*3/uL (ref 150–400)
RBC: 5.12 MIL/uL (ref 4.22–5.81)
RDW: 13.2 % (ref 11.5–15.5)
WBC: 9.2 10*3/uL (ref 4.0–10.5)
nRBC: 0 % (ref 0.0–0.2)

## 2020-01-14 LAB — TECHNOLOGIST SMEAR REVIEW: Plt Morphology: ADEQUATE

## 2020-01-15 LAB — IFE AND PE, RANDOM URINE
% BETA, Urine: 34.1 %
ALPHA 1 URINE: 1.2 %
Albumin, U: 31.3 %
Alpha 2, Urine: 13.9 %
GAMMA GLOBULIN URINE: 19.5 %
Total Protein, Urine: 16.1 mg/dL

## 2020-01-15 LAB — KAPPA/LAMBDA LIGHT CHAINS
Kappa free light chain: 34.3 mg/L — ABNORMAL HIGH (ref 3.3–19.4)
Kappa, lambda light chain ratio: 2.02 — ABNORMAL HIGH (ref 0.26–1.65)
Lambda free light chains: 17 mg/L (ref 5.7–26.3)

## 2020-01-16 LAB — MULTIPLE MYELOMA PANEL, SERUM
Albumin SerPl Elph-Mcnc: 3.8 g/dL (ref 2.9–4.4)
Albumin/Glob SerPl: 1.6 (ref 0.7–1.7)
Alpha 1: 0.2 g/dL (ref 0.0–0.4)
Alpha2 Glob SerPl Elph-Mcnc: 0.7 g/dL (ref 0.4–1.0)
B-Globulin SerPl Elph-Mcnc: 0.8 g/dL (ref 0.7–1.3)
Gamma Glob SerPl Elph-Mcnc: 0.7 g/dL (ref 0.4–1.8)
Globulin, Total: 2.4 g/dL (ref 2.2–3.9)
IgA: 134 mg/dL (ref 61–437)
IgG (Immunoglobin G), Serum: 717 mg/dL (ref 603–1613)
IgM (Immunoglobulin M), Srm: 72 mg/dL (ref 15–143)
Total Protein ELP: 6.2 g/dL (ref 6.0–8.5)

## 2020-01-27 ENCOUNTER — Telehealth: Payer: Self-pay

## 2020-01-27 NOTE — Telephone Encounter (Signed)
-----  Message from Earlie Server, MD sent at 01/23/2020  2:49 PM EDT ----- Please let patient know that her blood work showed negative for multiple myeloma work-up.  Slightly increased light chain ratio is acceptable in the context of his chronic kidney disease.  For now he does not need further follow-up with [place].  Thank you

## 2020-01-27 NOTE — Telephone Encounter (Signed)
Patient notified

## 2020-03-04 ENCOUNTER — Encounter: Payer: Self-pay | Admitting: Ophthalmology

## 2020-03-05 ENCOUNTER — Encounter: Payer: Self-pay | Admitting: Ophthalmology

## 2020-03-05 ENCOUNTER — Encounter: Payer: Self-pay | Admitting: Anesthesiology

## 2020-03-05 ENCOUNTER — Other Ambulatory Visit: Payer: Self-pay

## 2020-03-09 ENCOUNTER — Other Ambulatory Visit
Admission: RE | Admit: 2020-03-09 | Discharge: 2020-03-09 | Disposition: A | Discharge status: Home / Self Care | Payer: Medicare HMO | Source: Ambulatory Visit | Attending: Ophthalmology | Admitting: Ophthalmology

## 2020-03-09 ENCOUNTER — Other Ambulatory Visit: Payer: Self-pay

## 2020-03-09 DIAGNOSIS — Z20822 Contact with and (suspected) exposure to covid-19: Secondary | ICD-10-CM | POA: Insufficient documentation

## 2020-03-09 DIAGNOSIS — Z01812 Encounter for preprocedural laboratory examination: Secondary | ICD-10-CM | POA: Insufficient documentation

## 2020-03-09 LAB — SARS CORONAVIRUS 2 (TAT 6-24 HRS): SARS Coronavirus 2: NEGATIVE

## 2020-04-01 ENCOUNTER — Encounter: Payer: Self-pay | Admitting: Ophthalmology

## 2020-04-01 ENCOUNTER — Other Ambulatory Visit: Payer: Self-pay

## 2020-04-03 ENCOUNTER — Other Ambulatory Visit: Admission: RE | Admit: 2020-04-03 | Payer: Medicare HMO | Source: Ambulatory Visit

## 2020-04-07 ENCOUNTER — Other Ambulatory Visit: Payer: Self-pay

## 2020-04-07 ENCOUNTER — Other Ambulatory Visit
Admission: RE | Admit: 2020-04-07 | Discharge: 2020-04-07 | Disposition: A | Discharge status: Home / Self Care | Payer: Medicare HMO | Source: Ambulatory Visit | Attending: Ophthalmology | Admitting: Ophthalmology

## 2020-04-07 DIAGNOSIS — Z20822 Contact with and (suspected) exposure to covid-19: Secondary | ICD-10-CM | POA: Diagnosis not present

## 2020-04-07 DIAGNOSIS — Z01812 Encounter for preprocedural laboratory examination: Secondary | ICD-10-CM | POA: Diagnosis present

## 2020-04-07 NOTE — Discharge Instructions (Signed)

## 2020-04-08 ENCOUNTER — Encounter: Payer: Self-pay | Admitting: Anesthesiology

## 2020-04-08 ENCOUNTER — Ambulatory Visit
Admission: RE | Admit: 2020-04-08 | Discharge: 2020-04-08 | Disposition: A | Discharge status: Home / Self Care | Payer: Medicare HMO | Attending: Ophthalmology | Admitting: Ophthalmology

## 2020-04-08 ENCOUNTER — Other Ambulatory Visit: Payer: Self-pay

## 2020-04-08 ENCOUNTER — Encounter
Admission: RE | Disposition: A | Discharge status: Home / Self Care | Payer: Self-pay | Source: Home / Self Care | Attending: Ophthalmology

## 2020-04-08 ENCOUNTER — Encounter: Payer: Self-pay | Admitting: Ophthalmology

## 2020-04-08 DIAGNOSIS — H2181 Floppy iris syndrome: Secondary | ICD-10-CM | POA: Insufficient documentation

## 2020-04-08 DIAGNOSIS — H2511 Age-related nuclear cataract, right eye: Secondary | ICD-10-CM | POA: Insufficient documentation

## 2020-04-08 DIAGNOSIS — Z79899 Other long term (current) drug therapy: Secondary | ICD-10-CM | POA: Diagnosis not present

## 2020-04-08 DIAGNOSIS — I69354 Hemiplegia and hemiparesis following cerebral infarction affecting left non-dominant side: Secondary | ICD-10-CM | POA: Diagnosis not present

## 2020-04-08 DIAGNOSIS — Z7982 Long term (current) use of aspirin: Secondary | ICD-10-CM | POA: Diagnosis not present

## 2020-04-08 DIAGNOSIS — E7800 Pure hypercholesterolemia, unspecified: Secondary | ICD-10-CM | POA: Diagnosis not present

## 2020-04-08 DIAGNOSIS — Z85828 Personal history of other malignant neoplasm of skin: Secondary | ICD-10-CM | POA: Insufficient documentation

## 2020-04-08 DIAGNOSIS — Z87891 Personal history of nicotine dependence: Secondary | ICD-10-CM | POA: Diagnosis not present

## 2020-04-08 HISTORY — DX: Depression, unspecified: F32.A

## 2020-04-08 HISTORY — DX: Unspecified hearing loss, unspecified ear: H91.90

## 2020-04-08 HISTORY — DX: Other specified symptoms and signs involving the circulatory and respiratory systems: R09.89

## 2020-04-08 HISTORY — DX: Diverticulosis of intestine, part unspecified, without perforation or abscess without bleeding: K57.90

## 2020-04-08 HISTORY — DX: Cough, unspecified: R05.9

## 2020-04-08 HISTORY — DX: Chronic kidney disease, unspecified: N18.9

## 2020-04-08 HISTORY — DX: Unspecified osteoarthritis, unspecified site: M19.90

## 2020-04-08 HISTORY — PX: CATARACT EXTRACTION W/PHACO: SHX586

## 2020-04-08 HISTORY — DX: Presence of external hearing-aid: Z97.4

## 2020-04-08 LAB — SARS CORONAVIRUS 2 (TAT 6-24 HRS): SARS Coronavirus 2: NEGATIVE

## 2020-04-08 SURGERY — PHACOEMULSIFICATION, CATARACT, WITH IOL INSERTION
Anesthesia: Monitor Anesthesia Care | Site: Eye | Laterality: Right

## 2020-04-08 MED ORDER — ARMC OPHTHALMIC DILATING DROPS
1.0000 "application " | OPHTHALMIC | Status: DC | PRN
  Administered 2020-04-08 (×3): 1 via OPHTHALMIC

## 2020-04-08 MED ORDER — MOXIFLOXACIN HCL 0.5 % OP SOLN
1.0000 [drp] | OPHTHALMIC | Status: DC | PRN
  Administered 2020-04-08 (×3): 1 [drp] via OPHTHALMIC

## 2020-04-08 MED ORDER — LIDOCAINE HCL (PF) 2 % IJ SOLN
INTRAOCULAR | Status: DC | PRN
  Administered 2020-04-08: 1 mL

## 2020-04-08 MED ORDER — BRIMONIDINE TARTRATE-TIMOLOL 0.2-0.5 % OP SOLN
OPHTHALMIC | Status: DC | PRN
  Administered 2020-04-08: 1 [drp] via OPHTHALMIC

## 2020-04-08 MED ORDER — TETRACAINE HCL 0.5 % OP SOLN
1.0000 [drp] | OPHTHALMIC | Status: DC | PRN
  Administered 2020-04-08 (×3): 1 [drp] via OPHTHALMIC

## 2020-04-08 MED ORDER — LACTATED RINGERS IV SOLN: INTRAVENOUS | Status: DC

## 2020-04-08 MED ORDER — NA HYALUR & NA CHOND-NA HYALUR 0.4-0.35 ML IO KIT
PACK | INTRAOCULAR | Status: DC | PRN
  Administered 2020-04-08: 1 mL via INTRAOCULAR

## 2020-04-08 MED ORDER — CEFUROXIME OPHTHALMIC INJECTION 1 MG/0.1 ML
INJECTION | OPHTHALMIC | Status: DC | PRN
  Administered 2020-04-08: 0.1 mL via INTRACAMERAL

## 2020-04-08 MED ORDER — FENTANYL CITRATE (PF) 100 MCG/2ML IJ SOLN
INTRAMUSCULAR | Status: DC | PRN
Start: 2020-04-08 — End: 2020-04-08
  Administered 2020-04-08 (×2): 50 ug via INTRAVENOUS

## 2020-04-08 MED ORDER — MIDAZOLAM HCL 2 MG/2ML IJ SOLN
INTRAMUSCULAR | Status: DC | PRN
  Administered 2020-04-08: 2 mg via INTRAVENOUS

## 2020-04-08 MED ORDER — EPINEPHRINE PF 1 MG/ML IJ SOLN
INTRAOCULAR | Status: DC | PRN
  Administered 2020-04-08: 67 mL via OPHTHALMIC

## 2020-04-08 SURGICAL SUPPLY — 23 items
CANNULA ANT/CHMB 27G (MISCELLANEOUS) ×1 IMPLANT
CANNULA ANT/CHMB 27GA (MISCELLANEOUS) ×2 IMPLANT
GLOVE SURG LX 7.5 STRW (GLOVE) ×1
GLOVE SURG LX STRL 7.5 STRW (GLOVE) ×1 IMPLANT
GLOVE SURG TRIUMPH 8.0 PF LTX (GLOVE) ×2 IMPLANT
GOWN STRL REUS W/ TWL LRG LVL3 (GOWN DISPOSABLE) ×2 IMPLANT
GOWN STRL REUS W/TWL LRG LVL3 (GOWN DISPOSABLE) ×4
LENS IOL EYHANCE TORIC II 8.5 ×2 IMPLANT
LENS IOL EYHANCE TRC 225 8.5 IMPLANT
MARKER SKIN DUAL TIP RULER LAB (MISCELLANEOUS) ×2 IMPLANT
NDL CAPSULORHEX 25GA (NEEDLE) ×1 IMPLANT
NDL FILTER BLUNT 18X1 1/2 (NEEDLE) ×2 IMPLANT
NEEDLE CAPSULORHEX 25GA (NEEDLE) ×2 IMPLANT
NEEDLE FILTER BLUNT 18X 1/2SAF (NEEDLE) ×2
NEEDLE FILTER BLUNT 18X1 1/2 (NEEDLE) ×2 IMPLANT
PACK CATARACT BRASINGTON (MISCELLANEOUS) ×2 IMPLANT
PACK EYE AFTER SURG (MISCELLANEOUS) ×2 IMPLANT
PACK OPTHALMIC (MISCELLANEOUS) ×2 IMPLANT
SOLUTION OPHTHALMIC SALT (MISCELLANEOUS) ×2 IMPLANT
SYR 3ML LL SCALE MARK (SYRINGE) ×4 IMPLANT
SYR TB 1ML LUER SLIP (SYRINGE) ×2 IMPLANT
WATER STERILE IRR 250ML POUR (IV SOLUTION) ×2 IMPLANT
WIPE NON LINTING 3.25X3.25 (MISCELLANEOUS) ×2 IMPLANT

## 2020-04-08 NOTE — H&P (Signed)

## 2020-04-08 NOTE — Anesthesia Preprocedure Evaluation (Signed)
Anesthesia Evaluation  Patient identified by MRN, date of birth, ID band Patient awake    Reviewed: Allergy & Precautions, NPO status , Patient's Chart, lab work & pertinent test results  Airway Mallampati: II  TM Distance: >3 FB Neck ROM: Full    Dental no notable dental hx.    Pulmonary former smoker,    Pulmonary exam normal        Cardiovascular negative cardio ROS Normal cardiovascular exam     Neuro/Psych Depression CVA    GI/Hepatic   Endo/Other  negative endocrine ROS  Renal/GU CRFRenal disease     Musculoskeletal  (+) Arthritis ,   Abdominal Normal abdominal exam  (+)   Peds  Hematology negative hematology ROS (+)   Anesthesia Other Findings   Reproductive/Obstetrics                             Anesthesia Physical Anesthesia Plan  ASA: III  Anesthesia Plan: MAC   Post-op Pain Management:    Induction: Intravenous  PONV Risk Score and Plan: 1 and TIVA, Midazolam and Treatment may vary due to age or medical condition  Airway Management Planned: Natural Airway and Nasal Cannula  Additional Equipment: None  Intra-op Plan:   Post-operative Plan:   Informed Consent: I have reviewed the patients History and Physical, chart, labs and discussed the procedure including the risks, benefits and alternatives for the proposed anesthesia with the patient or authorized representative who has indicated his/her understanding and acceptance.     Dental advisory given  Plan Discussed with: CRNA  Anesthesia Plan Comments:         Anesthesia Quick Evaluation

## 2020-04-08 NOTE — Op Note (Signed)
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  Ultrasound time: Procedure(s): CATARACT EXTRACTION PHACO AND INTRAOCULAR LENS PLACEMENT (IOC) RIGHT EYHANCE TORIC 6.98  01:06.7  10.5% (Right)  LENS:   Implant Name Type Inv. Item Serial No. Manufacturer Lot No. LRB No. Used Action  LENS TORIC II EYHANCE 8.5 - [id number]  LENS TORIC II EYHANCE 8.5 [id number] [id number]   Right 1 Implanted     [id number] 8.5 D Toric intraocular lens with 2.25 diopters of cylindrical power with axis orientation at 174 degrees.    SURGEON:  Wyonia Hough, MD   ANESTHESIA: Topical with tetracaine drops and 2% Xylocaine jelly, augmented with 1% preservative-free intracameral lidocaine. .   COMPLICATIONS:  None.   DESCRIPTION OF PROCEDURE:  The patient was identified in the holding room and transported to the operating suite and placed in the supine position under the operating microscope.  The right eye was identified as the operative eye, and it was prepped and draped in the usual sterile ophthalmic fashion.    A clear-corneal paracentesis incision was made at the 12:00 position.  0.5 ml of preservative-free 1% lidocaine was injected into the anterior chamber. The anterior chamber was filled with Viscoat.  A 2.4 millimeter near clear corneal incision was then made at the 9:00 position.  A cystotome and capsulorrhexis forceps were then used to make a curvilinear capsulorrhexis.  Hydrodissection and hydrodelineation were then performed using balanced salt solution.   Phacoemulsification was then used in stop and chop fashion to remove the lens, nucleus and epinucleus.  The remaining cortex was aspirated using the  irrigation and aspiration handpiece.  Provisc viscoelastic was then placed into the capsular bag to distend it for lens placement.  The Verion digital marker was used to align the implant at the intended axis.   A Toric lens was then injected into the capsular bag.  It was rotated clockwise until the axis marks on the lens were approximately 15 degrees in the counterclockwise direction to the intended alignment.  The viscoelastic was aspirated from the eye using the irrigation aspiration handpiece.  Then, a Koch spatula through the sideport incision was used to rotate the lens in a clockwise direction until the axis markings of the intraocular lens were lined up with the Verion alignment.  Balanced salt solution was then used to hydrate the wounds. Cefuroxime 0.1 ml of a 10mg /ml solution was injected into the anterior chamber for a dose of 1 mg of intracameral antibiotic at the completion of the case.    The eye was noted to have a physiologic pressure and there was no wound leak noted.   Timolol and Brimonidine drops were applied to the eye.  The patient was taken to the recovery room in stable condition having had no complications of anesthesia or surgery.  Kyri Shader 04/08/2020, 11:46 AM

## 2020-04-08 NOTE — Anesthesia Procedure Notes (Signed)
Procedure Name: MAC Date/Time: 04/08/2020 11:20 AM Performed by: Jeannene Patella, CRNA Pre-anesthesia Checklist: Patient identified, Emergency Drugs available, Suction available, Timeout performed and Patient being monitored Patient Re-evaluated:Patient Re-evaluated prior to induction Oxygen Delivery Method: Nasal cannula Placement Confirmation: positive ETCO2

## 2020-04-08 NOTE — Transfer of Care (Signed)
Immediate Anesthesia Transfer of Care Note  Patient: Randy Hampton  Procedure(s) Performed: CATARACT EXTRACTION PHACO AND INTRAOCULAR LENS PLACEMENT (IOC) RIGHT EYHANCE TORIC 6.98  01:06.7  10.5% (Right Eye)  Patient Location: PACU  Anesthesia Type: MAC  Level of Consciousness: awake, alert  and patient cooperative  Airway and Oxygen Therapy: Patient Spontanous Breathing and Patient connected to supplemental oxygen  Post-op Assessment: Post-op Vital signs reviewed, Patient's Cardiovascular Status Stable, Respiratory Function Stable, Patent Airway and No signs of Nausea or vomiting  Post-op Vital Signs: Reviewed and stable  Complications: No complications documented.

## 2020-04-08 NOTE — Anesthesia Postprocedure Evaluation (Signed)
Anesthesia Post Note  Patient: Randy Hampton Jan  Procedure(s) Performed: CATARACT EXTRACTION PHACO AND INTRAOCULAR LENS PLACEMENT (IOC) RIGHT EYHANCE TORIC 6.98  01:06.7  10.5% (Right Eye)     Anesthesia Post Evaluation No complications documented.  Farris Blash [employer]

## 2020-04-09 ENCOUNTER — Encounter: Payer: Self-pay | Admitting: Ophthalmology

## 2020-04-20 ENCOUNTER — Other Ambulatory Visit: Payer: Self-pay

## 2020-04-20 ENCOUNTER — Encounter: Payer: Self-pay | Admitting: Ophthalmology

## 2020-04-20 ENCOUNTER — Other Ambulatory Visit
Admission: RE | Admit: 2020-04-20 | Discharge: 2020-04-20 | Disposition: A | Discharge status: Home / Self Care | Payer: Medicare HMO | Source: Ambulatory Visit | Attending: Ophthalmology | Admitting: Ophthalmology

## 2020-04-20 DIAGNOSIS — Z01812 Encounter for preprocedural laboratory examination: Secondary | ICD-10-CM | POA: Insufficient documentation

## 2020-04-20 DIAGNOSIS — Z20822 Contact with and (suspected) exposure to covid-19: Secondary | ICD-10-CM | POA: Diagnosis not present

## 2020-04-20 LAB — SARS CORONAVIRUS 2 (TAT 6-24 HRS): SARS Coronavirus 2: NEGATIVE

## 2020-04-20 NOTE — Discharge Instructions (Signed)

## 2020-04-22 ENCOUNTER — Other Ambulatory Visit: Payer: Self-pay

## 2020-04-22 ENCOUNTER — Ambulatory Visit: Payer: Medicare HMO | Admitting: Anesthesiology

## 2020-04-22 ENCOUNTER — Encounter: Payer: Self-pay | Admitting: Ophthalmology

## 2020-04-22 ENCOUNTER — Encounter
Admission: RE | Disposition: A | Discharge status: Home / Self Care | Payer: Self-pay | Source: Home / Self Care | Attending: Ophthalmology

## 2020-04-22 ENCOUNTER — Ambulatory Visit
Admission: RE | Admit: 2020-04-22 | Discharge: 2020-04-22 | Disposition: A | Discharge status: Home / Self Care | Payer: Medicare HMO | Attending: Ophthalmology | Admitting: Ophthalmology

## 2020-04-22 DIAGNOSIS — H2512 Age-related nuclear cataract, left eye: Secondary | ICD-10-CM | POA: Diagnosis present

## 2020-04-22 DIAGNOSIS — Z79899 Other long term (current) drug therapy: Secondary | ICD-10-CM | POA: Diagnosis not present

## 2020-04-22 DIAGNOSIS — E7800 Pure hypercholesterolemia, unspecified: Secondary | ICD-10-CM | POA: Insufficient documentation

## 2020-04-22 DIAGNOSIS — Z85828 Personal history of other malignant neoplasm of skin: Secondary | ICD-10-CM | POA: Diagnosis not present

## 2020-04-22 DIAGNOSIS — Z7982 Long term (current) use of aspirin: Secondary | ICD-10-CM | POA: Insufficient documentation

## 2020-04-22 DIAGNOSIS — I69354 Hemiplegia and hemiparesis following cerebral infarction affecting left non-dominant side: Secondary | ICD-10-CM | POA: Diagnosis not present

## 2020-04-22 DIAGNOSIS — Z87891 Personal history of nicotine dependence: Secondary | ICD-10-CM | POA: Diagnosis not present

## 2020-04-22 DIAGNOSIS — H9190 Unspecified hearing loss, unspecified ear: Secondary | ICD-10-CM | POA: Insufficient documentation

## 2020-04-22 HISTORY — PX: CATARACT EXTRACTION W/PHACO: SHX586

## 2020-04-22 SURGERY — PHACOEMULSIFICATION, CATARACT, WITH IOL INSERTION
Anesthesia: Monitor Anesthesia Care | Site: Eye | Laterality: Left

## 2020-04-22 MED ORDER — FENTANYL CITRATE (PF) 100 MCG/2ML IJ SOLN
INTRAMUSCULAR | Status: DC | PRN
Start: 2020-04-22 — End: 2020-04-22
  Administered 2020-04-22: 50 ug via INTRAVENOUS

## 2020-04-22 MED ORDER — TETRACAINE HCL 0.5 % OP SOLN
1.0000 [drp] | OPHTHALMIC | Status: DC | PRN
  Administered 2020-04-22 (×3): 1 [drp] via OPHTHALMIC

## 2020-04-22 MED ORDER — ARMC OPHTHALMIC DILATING DROPS
1.0000 "application " | OPHTHALMIC | Status: DC | PRN
  Administered 2020-04-22 (×3): 1 via OPHTHALMIC

## 2020-04-22 MED ORDER — CEFUROXIME OPHTHALMIC INJECTION 1 MG/0.1 ML
INJECTION | OPHTHALMIC | Status: DC | PRN
  Administered 2020-04-22: 0.1 mL via INTRACAMERAL

## 2020-04-22 MED ORDER — BRIMONIDINE TARTRATE-TIMOLOL 0.2-0.5 % OP SOLN
OPHTHALMIC | Status: DC | PRN
  Administered 2020-04-22: 1 [drp] via OPHTHALMIC

## 2020-04-22 MED ORDER — LACTATED RINGERS IV SOLN: INTRAVENOUS | Status: DC

## 2020-04-22 MED ORDER — LIDOCAINE HCL (PF) 2 % IJ SOLN
INTRAOCULAR | Status: DC | PRN
  Administered 2020-04-22: 2 mL

## 2020-04-22 MED ORDER — PHENYLEPHRINE-KETOROLAC 1-0.3 % IO SOLN
INTRAOCULAR | Status: DC | PRN
  Administered 2020-04-22: 86 mL via OPHTHALMIC

## 2020-04-22 MED ORDER — EPINEPHRINE PF 1 MG/ML IJ SOLN
INTRAOCULAR | Status: DC | PRN
  Administered 2020-04-22: 86 mL via OPHTHALMIC

## 2020-04-22 MED ORDER — ACETAMINOPHEN 160 MG/5ML PO SOLN: 325.0000 mg | ORAL | Status: DC | PRN

## 2020-04-22 MED ORDER — MIDAZOLAM HCL 2 MG/2ML IJ SOLN
INTRAMUSCULAR | Status: DC | PRN
  Administered 2020-04-22: 2 mg via INTRAVENOUS

## 2020-04-22 MED ORDER — NA HYALUR & NA CHOND-NA HYALUR 0.4-0.35 ML IO KIT
PACK | INTRAOCULAR | Status: DC | PRN
  Administered 2020-04-22: 1 mL via INTRAOCULAR

## 2020-04-22 MED ORDER — ACETAMINOPHEN 325 MG PO TABS: 325.0000 mg | ORAL_TABLET | ORAL | Status: DC | PRN

## 2020-04-22 MED ORDER — MOXIFLOXACIN HCL 0.5 % OP SOLN
1.0000 [drp] | OPHTHALMIC | Status: DC | PRN
  Administered 2020-04-22 (×3): 1 [drp] via OPHTHALMIC

## 2020-04-22 SURGICAL SUPPLY — 23 items
CANNULA ANT/CHMB 27G (MISCELLANEOUS) ×1 IMPLANT
CANNULA ANT/CHMB 27GA (MISCELLANEOUS) ×2 IMPLANT
GLOVE SURG LX 7.5 STRW (GLOVE) ×1
GLOVE SURG LX STRL 7.5 STRW (GLOVE) ×1 IMPLANT
GLOVE SURG TRIUMPH 8.0 PF LTX (GLOVE) ×2 IMPLANT
GOWN STRL REUS W/ TWL LRG LVL3 (GOWN DISPOSABLE) ×2 IMPLANT
GOWN STRL REUS W/TWL LRG LVL3 (GOWN DISPOSABLE) ×4
LENS IOL EYHANCE TORIC II 10.5 ×2 IMPLANT
LENS IOL EYHANCE TRC 225 10.5 IMPLANT
MARKER SKIN DUAL TIP RULER LAB (MISCELLANEOUS) ×2 IMPLANT
NDL CAPSULORHEX 25GA (NEEDLE) ×1 IMPLANT
NDL FILTER BLUNT 18X1 1/2 (NEEDLE) ×2 IMPLANT
NEEDLE CAPSULORHEX 25GA (NEEDLE) ×2 IMPLANT
NEEDLE FILTER BLUNT 18X 1/2SAF (NEEDLE) ×2
NEEDLE FILTER BLUNT 18X1 1/2 (NEEDLE) ×2 IMPLANT
PACK CATARACT BRASINGTON (MISCELLANEOUS) ×2 IMPLANT
PACK EYE AFTER SURG (MISCELLANEOUS) ×2 IMPLANT
PACK OPTHALMIC (MISCELLANEOUS) ×2 IMPLANT
SOLUTION OPHTHALMIC SALT (MISCELLANEOUS) ×2 IMPLANT
SYR 3ML LL SCALE MARK (SYRINGE) ×4 IMPLANT
SYR TB 1ML LUER SLIP (SYRINGE) ×2 IMPLANT
WATER STERILE IRR 250ML POUR (IV SOLUTION) ×2 IMPLANT
WIPE NON LINTING 3.25X3.25 (MISCELLANEOUS) ×2 IMPLANT

## 2020-04-22 NOTE — H&P (Signed)

## 2020-04-22 NOTE — Anesthesia Procedure Notes (Signed)
Procedure Name: MAC Date/Time: 04/22/2020 8:13 AM Performed by: Silvana Newness, CRNA Pre-anesthesia Checklist: Patient identified, Emergency Drugs available, Suction available, Patient being monitored and Timeout performed Patient Re-evaluated:Patient Re-evaluated prior to induction Oxygen Delivery Method: Nasal cannula Placement Confirmation: positive ETCO2

## 2020-04-22 NOTE — Transfer of Care (Signed)
Immediate Anesthesia Transfer of Care Note  Patient: Randy Hampton  Procedure(s) Performed: CATARACT EXTRACTION PHACO AND INTRAOCULAR LENS PLACEMENT (IOC) LEFT EYHANCE TORIC 7.28 01:16.0 9.6% (Left Eye)  Patient Location: PACU  Anesthesia Type: MAC  Level of Consciousness: awake, alert  and patient cooperative  Airway and Oxygen Therapy: Patient Spontanous Breathing and Patient connected to supplemental oxygen  Post-op Assessment: Post-op Vital signs reviewed, Patient's Cardiovascular Status Stable, Respiratory Function Stable, Patent Airway and No signs of Nausea or vomiting  Post-op Vital Signs: Reviewed and stable  Complications: No complications documented.

## 2020-04-22 NOTE — Anesthesia Postprocedure Evaluation (Signed)
Anesthesia Post Note  Patient: ROMUALD MCCASLIN  Procedure(s) Performed: CATARACT EXTRACTION PHACO AND INTRAOCULAR LENS PLACEMENT (IOC) LEFT EYHANCE TORIC 7.28 01:16.0 9.6% (Left Eye)     Patient location during evaluation: PACU Anesthesia Type: MAC Level of consciousness: awake and alert Pain management: pain level controlled Vital Signs Assessment: post-procedure vital signs reviewed and stable Respiratory status: spontaneous breathing, nonlabored ventilation, respiratory function stable and patient connected to nasal cannula oxygen Cardiovascular status: stable and blood pressure returned to baseline Postop Assessment: no apparent nausea or vomiting Anesthetic complications: no   No complications documented.  Trecia Rogers

## 2020-04-22 NOTE — Anesthesia Preprocedure Evaluation (Signed)
Anesthesia Evaluation  Patient identified by MRN, date of birth, ID band Patient awake    Reviewed: Allergy & Precautions, H&P , NPO status , Patient's Chart, lab work & pertinent test results, reviewed documented beta blocker date and time   Airway Mallampati: II  TM Distance: >3 FB Neck ROM: full    Dental no notable dental hx.    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   Neuro/Psych negative neurological ROS  negative psych ROS   GI/Hepatic negative GI ROS, Neg liver ROS,   Endo/Other  negative endocrine ROS  Renal/GU CRFRenal disease  negative genitourinary   Musculoskeletal   Abdominal   Peds  Hematology negative hematology ROS (+)   Anesthesia Other Findings   Reproductive/Obstetrics negative OB ROS                             Anesthesia Physical Anesthesia Plan  ASA: II  Anesthesia Plan: MAC   Post-op Pain Management:    Induction:   PONV Risk Score and Plan:   Airway Management Planned:   Additional Equipment:   Intra-op Plan:   Post-operative Plan:   Informed Consent: I have reviewed the patients History and Physical, chart, labs and discussed the procedure including the risks, benefits and alternatives for the proposed anesthesia with the patient or authorized representative who has indicated his/her understanding and acceptance.     Dental Advisory Given  Plan Discussed with: CRNA  Anesthesia Plan Comments:         Anesthesia Quick Evaluation

## 2020-04-22 NOTE — Op Note (Signed)
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   PROCEDURE:  Phacoemulsification with Toric posterior chamber intraocular lens placement of the left eye.  Ultrasound time: Procedure(s): CATARACT EXTRACTION PHACO AND INTRAOCULAR LENS PLACEMENT (IOC) LEFT EYHANCE TORIC 7.28 01:16.0 9.6% (Left) LENS:[id number] Eyhance II Toric 10.5 D Iintraocular lens with 2.25 diopters of cylindrical power with axis orientation at 179 degrees.    SURGEON:  Wyonia Hough, MD   ANESTHESIA:  Topical with tetracaine drops and 2% Xylocaine jelly, augmented with 1% preservative-free intracameral lidocaine.  COMPLICATIONS:  None.   DESCRIPTION OF PROCEDURE:  The patient was identified in the holding room and transported to the operating suite and placed in the supine position under the operating microscope.  The left eye was identified as the operative eye, and it was prepped and draped in the usual sterile ophthalmic fashion.    A clear-corneal paracentesis incision was made at the 1:30 position.  0.5 ml of preservative-free 1% lidocaine was injected into the anterior chamber. The anterior chamber was filled with Viscoat.  A 2.4 millimeter near clear corneal incision was then made at the 10:30 position.  A cystotome and capsulorrhexis forceps were then used to make a curvilinear capsulorrhexis.  Hydrodissection and hydrodelineation were then performed using balanced salt solution.   Phacoemulsification was then used in stop and chop fashion to remove the lens, nucleus and epinucleus.  The remaining cortex was aspirated using the irrigation and aspiration handpiece.  Provisc viscoelastic was then placed into the capsular bag to distend it for lens placement.  The Verion digital marker was used to align the implant at the intended axis.   A 10.5 diopter lens was then injected  into the capsular bag.  It was rotated clockwise until the axis marks on the lens were approximately 15 degrees in the counterclockwise direction to the intended alignment.  The viscoelastic was aspirated from the eye using the irrigation aspiration handpiece.  Then, a Koch spatula through the sideport incision was used to rotate the lens in a clockwise direction until the axis markings of the intraocular lens were lined up with the Verion alignment.  Balanced salt solution was then used to hydrate the wounds. Cefuroxime 0.1 ml of a 10mg /ml solution was injected into the anterior chamber for a dose of 1 mg of intracameral antibiotic at the completion of the case.    The eye was noted to have a physiologic pressure and there was no wound leak noted.   Timolol and Brimonidine drops were applied to the eye.  The patient was taken to the recovery room in stable condition having had no complications of anesthesia or surgery.  Zaivion Kundrat 04/22/2020, 8:41 AM

## 2021-07-16 IMAGING — CT CT HEAD WITHOUT CONTRAST
5 of 7 series · 17 of 47 positions shown, 18 images · non-contrast
Comparison: 03/27/2013

CLINICAL DATA: Fell down steps with laceration to the right side of
the head. Headache and neck pain.

EXAM:
CT HEAD WITHOUT CONTRAST
CT CERVICAL SPINE WITHOUT CONTRAST
TECHNIQUE: Multidetector CT imaging of the head and cervical spine was
performed following the standard protocol without intravenous
contrast. Multiplanar CT image reconstructions of the cervical spine
were also generated.

[Series 2: head wo · axial · 0.47mm/px · z∈[-102,-52]mm · 2 of 30 slices shown, 3 images]
[im 10/30  brain]
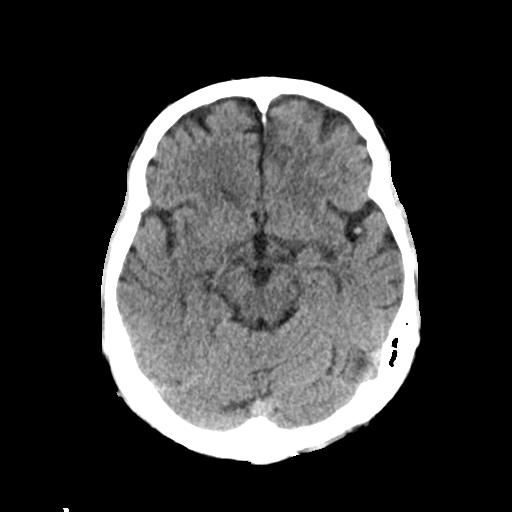
[im 10/30  bone]
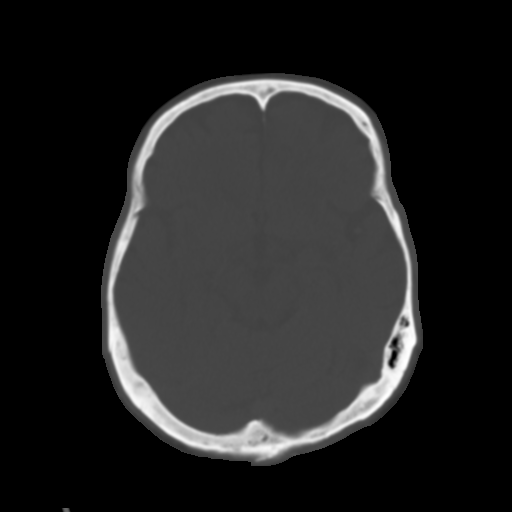
[im 20/30  brain]
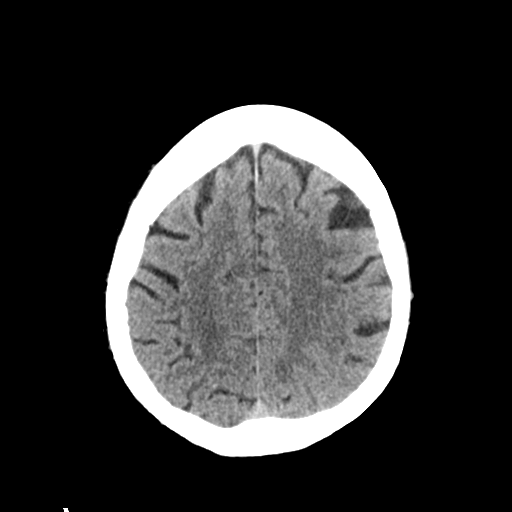

[Series 4: coronal soft tissue · coronal · 0.30mm/px · 3 of 60 slices shown]
[im 23/60  brain]
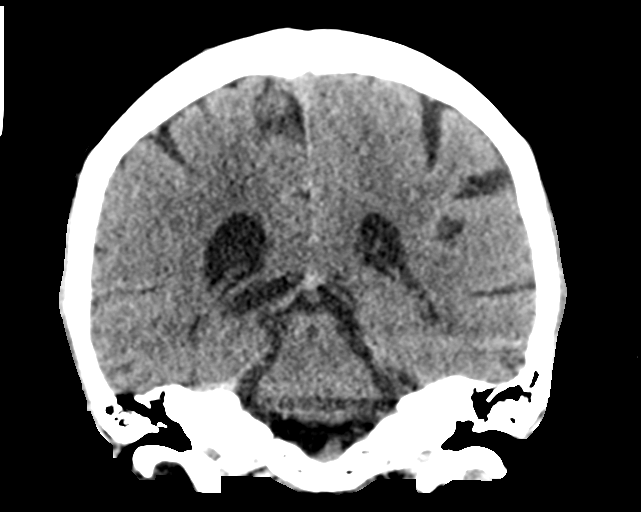
[im 30/60  brain]
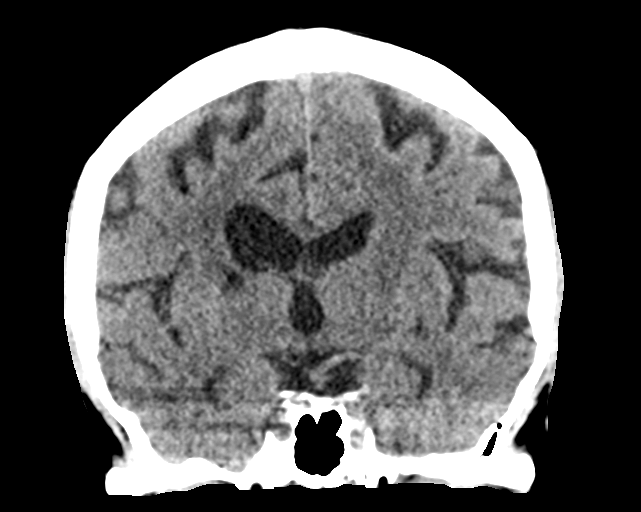
[im 37/60  brain]
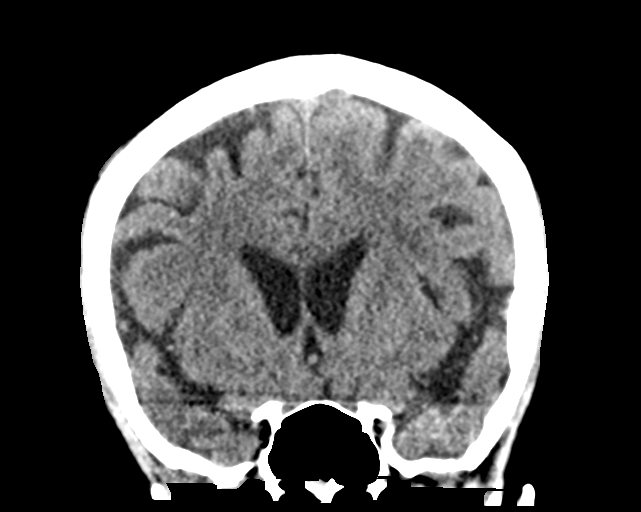

[Series 5: sagittal soft tissue · sagittal · 0.29mm/px · 1 of 53 slices shown]
[im 27/53  brain]
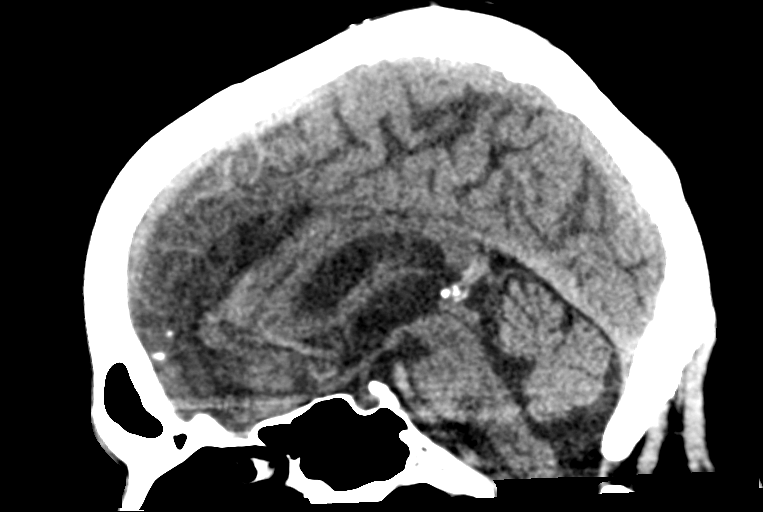

[Series 7: c spine soft · axial · 0.31mm/px · z∈[-307,-261]mm · 3 of 85 slices shown]
[im 8/85  brain]
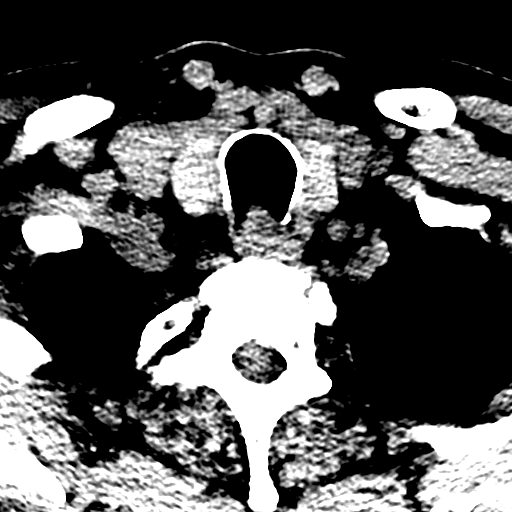
[im 16/85  brain]
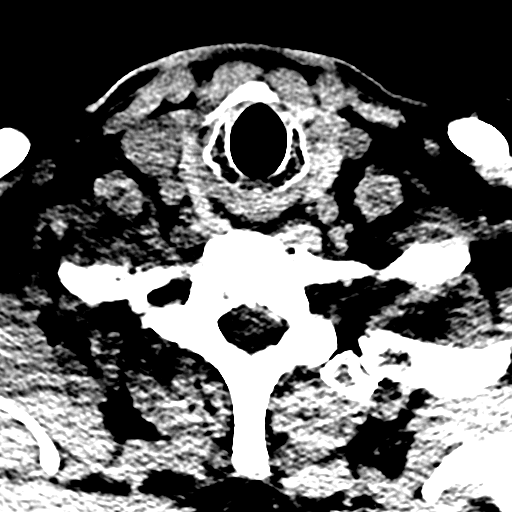
[im 31/85  brain]
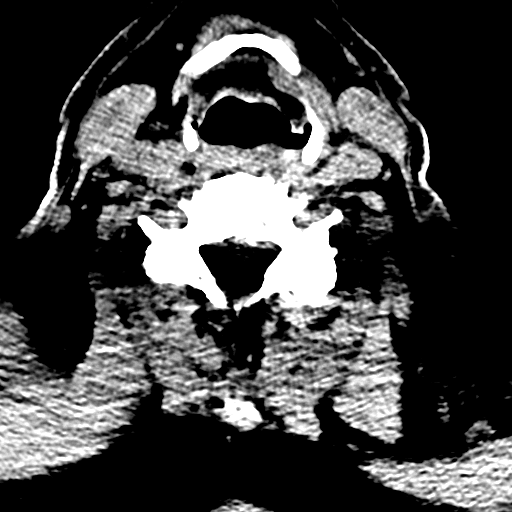

[Series 12: orthogonal bone · axial · 0.26mm/px · z∈[-350,-187]mm · 8 of 103 slices shown]
[im 8/103  bone]
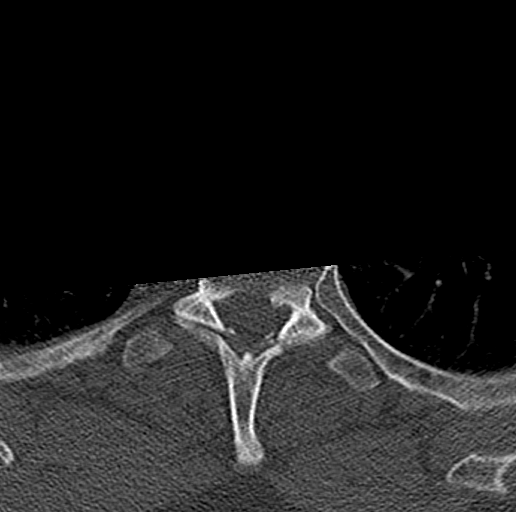
[im 24/103  bone]
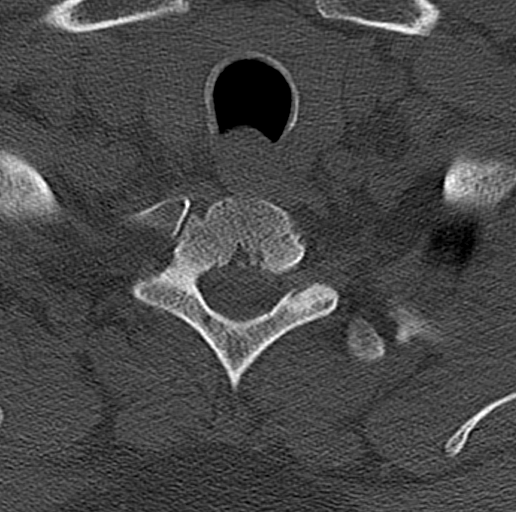
[im 32/103  bone]
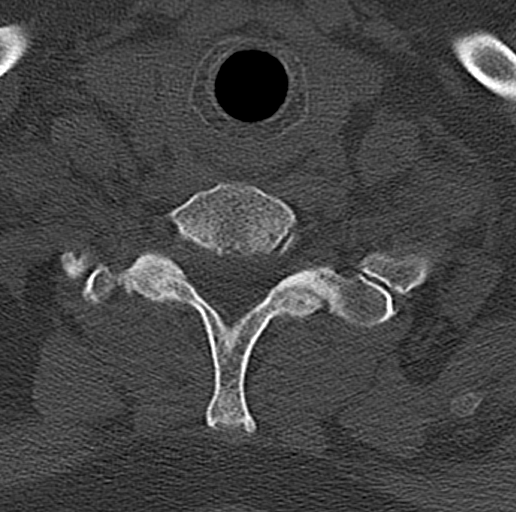
[im 48/103  bone]
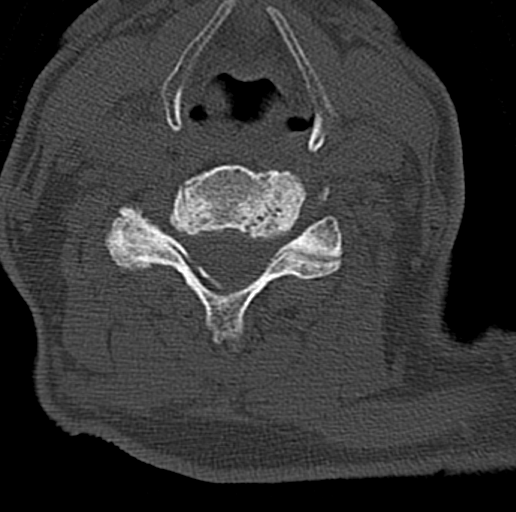
[im 55/103  bone]
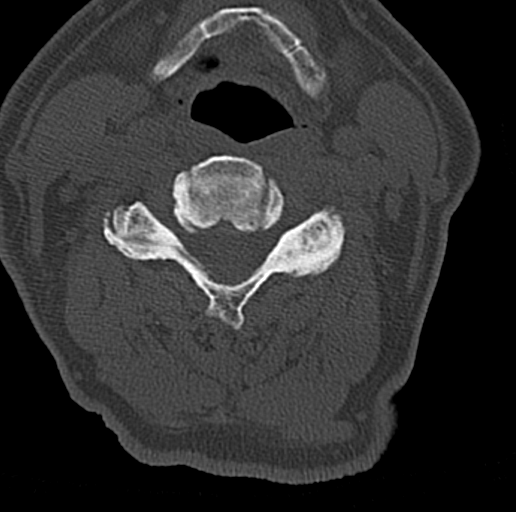
[im 71/103  bone]
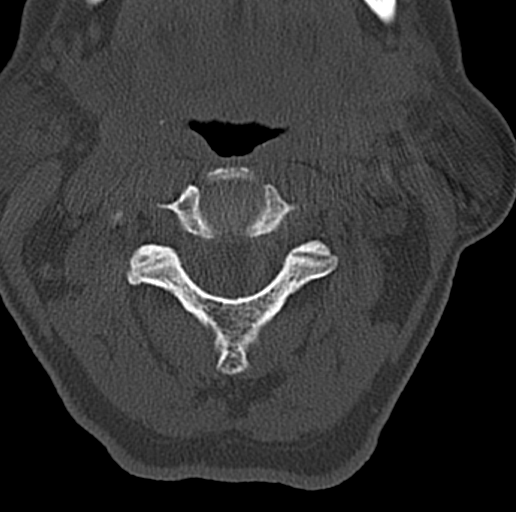
[im 79/103  bone]
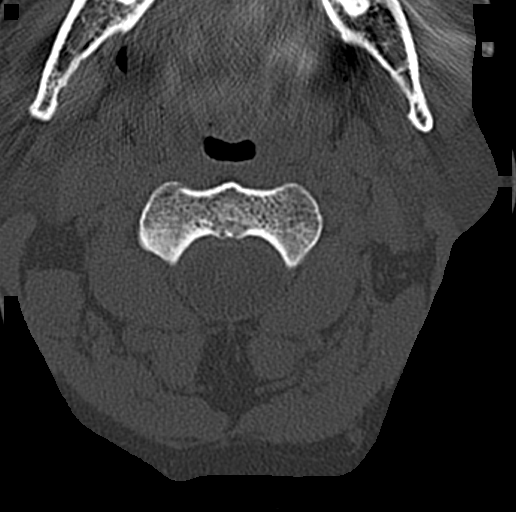
[im 95/103  bone]
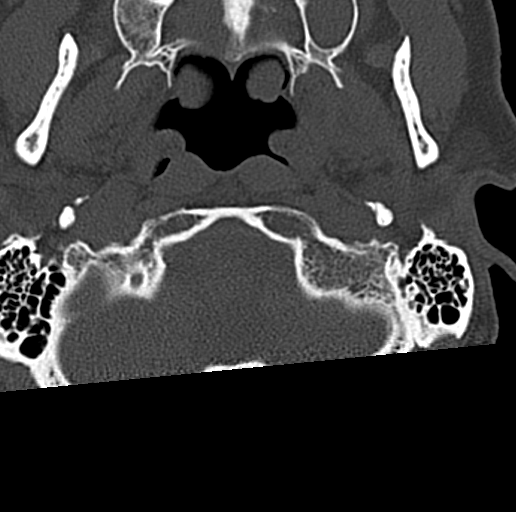

[17 of 47 positions shown; findings below may reference images not displayed]

FINDINGS: CT HEAD FINDINGS

Brain: Generalized atrophy. Chronic small-vessel ischemic changes of
the white matter. Dilated perivascular space at the base of the
brain on the left. Old right corona radiata lacunar infarction. No
sign of acute infarction, mass lesion, hemorrhage, hydrocephalus or
extra-axial collection.

Vascular: There is atherosclerotic calcification of the major
vessels at the base of the brain.

Skull: No skull fracture.

Sinuses/Orbits: Clear/normal

Other: Right-sided scalp injury evident.

CT CERVICAL SPINE FINDINGS

Alignment: No traumatic malalignment.

Skull base and vertebrae: No fracture or focal bone lesion.

Soft tissues and spinal canal: No soft tissue swelling or soft
tissue lesion.

Disc levels: Degenerative spondylosis from C3-4 through C6-7. Facet
osteoarthritis most pronounced on the right at C2-3 and C6-7 and on
the left at C3-4, C4-5 and C6-7. No severe canal or foraminal
stenosis.

Upper chest: Negative

Other: None
IMPRESSION: Head CT: No acute intracranial finding. Atrophy and old small vessel
infarctions. No skull fracture. Right scalp injury.

Cervical spine CT: No acute or traumatic finding. Ordinary
spondylosis and facet osteoarthritis.
